# Patient Record
Sex: Female | Born: 2000 | Race: Black or African American | Hispanic: No | Marital: Single | Smoking: Never smoker
Health system: Southern US, Community
[De-identification: ages and names within clinical notes are randomized; demographics above are authoritative.]

## PROBLEM LIST (undated history)

## (undated) DIAGNOSIS — R17 Unspecified jaundice: Secondary | ICD-10-CM

## (undated) DIAGNOSIS — N390 Urinary tract infection, site not specified: Secondary | ICD-10-CM

## (undated) DIAGNOSIS — F988 Other specified behavioral and emotional disorders with onset usually occurring in childhood and adolescence: Secondary | ICD-10-CM

## (undated) DIAGNOSIS — J45909 Unspecified asthma, uncomplicated: Secondary | ICD-10-CM

## (undated) DIAGNOSIS — B019 Varicella without complication: Secondary | ICD-10-CM

## (undated) DIAGNOSIS — K219 Gastro-esophageal reflux disease without esophagitis: Secondary | ICD-10-CM

## (undated) HISTORY — DX: Unspecified jaundice: R17

## (undated) HISTORY — PX: TYMPANOSTOMY TUBE PLACEMENT: SHX32

## (undated) HISTORY — DX: Varicella without complication: B01.9

## (undated) HISTORY — DX: Gastro-esophageal reflux disease without esophagitis: K21.9

## (undated) HISTORY — DX: Other specified behavioral and emotional disorders with onset usually occurring in childhood and adolescence: F98.8

## (undated) HISTORY — DX: Urinary tract infection, site not specified: N39.0

---
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
## 2022-01-14 ENCOUNTER — Other Ambulatory Visit: Payer: Self-pay | Admitting: Physician Assistant

## 2022-01-21 ENCOUNTER — Encounter: Payer: Self-pay | Admitting: Physician Assistant

## 2022-01-21 ENCOUNTER — Ambulatory Visit (INDEPENDENT_AMBULATORY_CARE_PROVIDER_SITE_OTHER): Payer: BC Managed Care – PPO | Admitting: Physician Assistant

## 2022-01-21 VITALS — BP 100/66 | HR 95 | Temp 97.8°F | Ht 67.34 in | Wt 220.4 lb

## 2022-01-21 DIAGNOSIS — Z8379 Family history of other diseases of the digestive system: Secondary | ICD-10-CM | POA: Diagnosis not present

## 2022-01-21 DIAGNOSIS — Z0000 Encounter for general adult medical examination without abnormal findings: Secondary | ICD-10-CM | POA: Diagnosis not present

## 2022-01-21 DIAGNOSIS — E7800 Pure hypercholesterolemia, unspecified: Secondary | ICD-10-CM | POA: Diagnosis not present

## 2022-01-21 DIAGNOSIS — R140 Abdominal distension (gaseous): Secondary | ICD-10-CM

## 2022-01-21 LAB — CBC WITH DIFFERENTIAL/PLATELET
Basophils Absolute: 0 10*3/uL (ref 0.0–0.1)
Basophils Relative: 0.8 % (ref 0.0–3.0)
Eosinophils Absolute: 0.2 10*3/uL (ref 0.0–0.7)
Eosinophils Relative: 4.1 % (ref 0.0–5.0)
HCT: 41.5 % (ref 36.0–49.0)
Hemoglobin: 13.8 g/dL (ref 12.0–16.0)
Lymphocytes Relative: 37.6 % (ref 24.0–48.0)
Lymphs Abs: 2.2 10*3/uL (ref 0.7–4.0)
MCHC: 33.3 g/dL (ref 31.0–37.0)
MCV: 94.1 fl (ref 78.0–98.0)
Monocytes Absolute: 0.4 10*3/uL (ref 0.1–1.0)
Monocytes Relative: 6.8 % (ref 3.0–12.0)
Neutro Abs: 2.9 10*3/uL (ref 1.4–7.7)
Neutrophils Relative %: 50.7 % (ref 43.0–71.0)
Platelets: 174 10*3/uL (ref 150.0–575.0)
RBC: 4.41 Mil/uL (ref 3.80–5.70)
RDW: 12.9 % (ref 11.4–15.5)
WBC: 5.8 10*3/uL (ref 4.5–13.5)

## 2022-01-21 LAB — COMPREHENSIVE METABOLIC PANEL
ALT: 21 U/L (ref 0–35)
AST: 16 U/L (ref 0–37)
Albumin: 4.2 g/dL (ref 3.5–5.2)
Alkaline Phosphatase: 36 U/L — ABNORMAL LOW (ref 47–119)
BUN: 11 mg/dL (ref 6–23)
CO2: 27 mEq/L (ref 19–32)
Calcium: 9.3 mg/dL (ref 8.4–10.5)
Chloride: 105 mEq/L (ref 96–112)
Creatinine, Ser: 0.97 mg/dL (ref 0.40–1.20)
GFR: 84.9 mL/min (ref >60.00)
Glucose, Bld: 86 mg/dL (ref 70–99)
Potassium: 3.7 mEq/L (ref 3.5–5.1)
Sodium: 141 mEq/L (ref 135–145)
Total Bilirubin: 0.5 mg/dL (ref 0.2–1.2)
Total Protein: 7.1 g/dL (ref 6.0–8.3)

## 2022-01-21 LAB — LIPID PANEL
Cholesterol: 275 mg/dL — ABNORMAL HIGH (ref 0–200)
HDL: 59.4 mg/dL (ref >39.00)
LDL Cholesterol: 198 mg/dL — ABNORMAL HIGH (ref 0–99)
NonHDL: 215.87
Total CHOL/HDL Ratio: 5
Triglycerides: 87 mg/dL (ref 0.0–149.0)
VLDL: 17.4 mg/dL (ref 0.0–40.0)

## 2022-01-21 NOTE — Patient Instructions (Addendum)
Good to see you today, Morgan Marshall! ? ?Please go to the lab for blood work and I will send results through MyChart. ?Pending celiac labs, will determine next steps from there. ? ?Continue lifestyle habits. Walking daily. Limit sugary foods. ? ?Please check with parents about prior HPV vaccine status and Tetanus (Tdap) status.  ? ?Have a great summer!  ?

## 2022-01-21 NOTE — Progress Notes (Signed)
? ?Subjective:  ? ? Patient ID: Morgan Marshall, female    DOB: [birth year], 21 y.o.   MRN: [id number] ? ?Chief Complaint  ?Patient presents with  ? Annual Exam  ? Gastroesophageal Reflux  ?  Pt says it has worsened.   ? Form Completion  ?  Requesting dog companion.  ? ? ?Patient is in today for annual exam. ? ?Acute concerns: ?Wants to be tested for celiac. States this was positive in third grade when tested in [place]. Bloated, some diarrhea, extra full feelings. Dad has hx of celiac.  ?Pt tries to avoid gluten. Symptoms worse with gluten - usually has some n/v with this as well.  ? ?Health maintenance: ?Lifestyle/ exercise: Doing a lot of traveling recently; trying to walk mostly ?Nutrition: Attempts to be mostly gluten-free  ?Mental health: Will start Lexapro 10 mg this summer if needed, currently on Buspar 7.5 mg once daily ; less stressed since college semester is over  ?Caffeine: Diet soda; not a coffee or energy drinker ?Sleep: Better now that she's home from college  ?Substance use: None ?ETOH: None  ?Sexual activity: Never has been sexually active  ?Menstrual cycles: More regular, takes Junel Fe   ?Immunizations: Check on HPV and Tdap  ? ? ?Past Medical History:  ?Diagnosis Date  ? Asthma   ? Chicken pox   ? GERD (gastroesophageal reflux disease)   ? Jaundice   ? Urinary tract infection   ? ? ?Past Surgical History:  ?Procedure Laterality Date  ? TYMPANOSTOMY TUBE PLACEMENT    ? ? ?Family History  ?Problem Relation Age of Onset  ? Heart attack Mother   ? Hyperlipidemia Father   ? Asthma Brother   ? Arthritis Maternal Grandmother   ? Asthma Maternal Grandmother   ? Hyperlipidemia Maternal Grandmother   ? Hyperlipidemia Maternal Grandfather   ? Hyperlipidemia Paternal Grandmother   ? Early death Paternal Grandfather   ? ? ?Social History  ? ?Tobacco Use  ? Smoking status: Never  ? Smokeless tobacco: Never  ?Vaping Use  ? Vaping Use: Never used  ?Substance Use Topics  ? Alcohol use: Not Currently  ? Drug use: Not  Currently  ?  ? ?Allergies  ?Allergen Reactions  ? Latex   ? ? ?Review of Systems ?NEGATIVE UNLESS OTHERWISE INDICATED IN HPI ? ? ?   ?Objective:  ?  ? ?BP 100/66 (BP Location: Left Arm, Patient Position: Sitting, Cuff Size: Large)   Pulse 95   Temp 97.8 ?F (36.6 ?C) (Temporal)   Ht 5' 7.34" (1.71 m)   Wt 220 lb 6.4 oz (100 kg)   LMP 01/16/2022 (Exact Date)   SpO2 96%   BMI 34.17 kg/m?  ? ?Wt Readings from Last 3 Encounters:  ?01/21/22 220 lb 6.4 oz (100 kg) (99 %, Z= 2.21)*  ?12/31/21 219 lb (99.3 kg) (99 %, Z= 2.19)*  ?12/23/21 223 lb (101.2 kg) (99 %, Z= 2.24)*  ? ?* Growth percentiles are based on CDC (Girls, 2-20 Years) data.  ? ? ?BP Readings from Last 3 Encounters:  ?01/21/22 100/66  ?12/23/21 105/65  ?07/18/21 103/70  ?  ? ?Physical Exam ?Vitals and nursing note reviewed.  ?Constitutional:   ?   Appearance: Normal appearance. She is obese. She is not toxic-appearing.  ?HENT:  ?   Head: Normocephalic and atraumatic.  ?   Right Ear: Tympanic membrane, ear canal and external ear normal.  ?   Left Ear: Tympanic membrane, ear canal and external ear normal.  ?  Nose: Nose normal.  ?   Mouth/Throat:  ?   Mouth: Mucous membranes are moist.  ?Eyes:  ?   Extraocular Movements: Extraocular movements intact.  ?   Conjunctiva/sclera: Conjunctivae normal.  ?   Pupils: Pupils are equal, round, and reactive to light.  ?Cardiovascular:  ?   Rate and Rhythm: Normal rate and regular rhythm.  ?   Pulses: Normal pulses.  ?   Heart sounds: Normal heart sounds.  ?Pulmonary:  ?   Effort: Pulmonary effort is normal.  ?   Breath sounds: Normal breath sounds.  ?Abdominal:  ?   General: Abdomen is flat. Bowel sounds are normal.  ?   Palpations: Abdomen is soft.  ?Musculoskeletal:     ?   General: Normal range of motion.  ?   Cervical back: Normal range of motion and neck supple.  ?Skin: ?   General: Skin is warm and dry.  ?   Comments: No concerning moles noted ?  ?Neurological:  ?   General: No focal deficit present.  ?    Mental Status: She is alert and oriented to person, place, and time.  ?Psychiatric:     ?   Mood and Affect: Mood normal.     ?   Behavior: Behavior normal.     ?   Thought Content: Thought content normal.     ?   Judgment: Judgment normal.  ? ? ?   ?Assessment & Plan:  ? ?Problem List Items Addressed This Visit   ?None ?Visit Diagnoses   ? ? Encounter for annual physical exam    -  Primary  ? Relevant Orders  ? CBC with Differential/Platelet  ? Comprehensive metabolic panel  ? Lipid panel  ? High cholesterol      ? Relevant Orders  ? Lipid panel  ? Family history of celiac disease      ? Relevant Orders  ? Tissue transglutaminase, IgA  ? IgA  ? Generalized bloating      ? Relevant Orders  ? Tissue transglutaminase, IgA  ? IgA  ? ?  ? ? ?PLAN: ?Age-appropriate screening and counseling performed today. Will check labs and call with results. Preventive measures discussed and printed in AVS for patient.  ?-Patient also requesting specific labs for celiac screening.  Pending results will help to guide next steps. ?-Need to check on status of Tdap and HPV vaccines. ?-Continue to work on healthy lifestyle habits. ?-Not sexually active. ?-Encourage to wear seatbelt and avoid texting while driving. ? ?F/up 1 year or prn  ? ? ?This note was prepared with assistance of Conservation officer, historic buildings. Occasional wrong-word or sound-a-like substitutions may have occurred due to the inherent limitations of voice recognition software. ? ? ? ?Aaniyah Strohm M Rigley Niess, PA-C ?

## 2022-01-22 LAB — IGA: Immunoglobulin A: 108 mg/dL (ref 47–310)

## 2022-01-22 LAB — TISSUE TRANSGLUTAMINASE, IGA: (tTG) Ab, IgA: <1 U/mL

## 2022-02-05 ENCOUNTER — Ambulatory Visit (INDEPENDENT_AMBULATORY_CARE_PROVIDER_SITE_OTHER): Payer: BC Managed Care – PPO | Admitting: Orthopaedic Surgery

## 2022-02-05 DIAGNOSIS — M222X2 Patellofemoral disorders, left knee: Secondary | ICD-10-CM

## 2022-02-05 DIAGNOSIS — M222X1 Patellofemoral disorders, right knee: Secondary | ICD-10-CM

## 2022-02-05 NOTE — Progress Notes (Signed)
Chief Complaint: Bilateral knee pain     History of Present Illness:   02/05/2022: Presents today for follow-up of bilateral knee pain.  She states she got approximately 24 hours of relief from her previous injections but these were off.  She has been able to work on her exercises somewhat although this has been very limited.  She is scheduled to take a trip abroad to [place] later today.  Here today for further assessment.  Morgan Marshall is a 21 y.o. female presents with multiple years of bilateral knee pain.  She states that this is recently flared up as she is stopped playing soccer.  She does have a history of proximal tibia fracture as a child.  She states that she did not have any significant pain following that time but since she has not stopped soccer and being more active recently she states that the knee pain in the front of the knee has flared up in particular.  She has significant aggravation with going up and down stairs.  She has trialed anti-inflammatories with some relief.  She has not done physical therapy    Surgical History:   None  PMH/PSH/Family History/Social History/Meds/Allergies:    Past Medical History:  Diagnosis Date   Asthma    Chicken pox    GERD (gastroesophageal reflux disease)    Jaundice    Urinary tract infection    Past Surgical History:  Procedure Laterality Date   TYMPANOSTOMY TUBE PLACEMENT     Social History   Socioeconomic History   Marital status: Single    Spouse name: Not on file   Number of children: Not on file   Years of education: Not on file   Highest education level: Not on file  Occupational History   Not on file  Tobacco Use   Smoking status: Never   Smokeless tobacco: Never  Vaping Use   Vaping Use: Never used  Substance and Sexual Activity   Alcohol use: Not Currently   Drug use: Not Currently   Sexual activity: Not on file  Other Topics Concern   Not on file  Social History  Narrative   Not on file   Social Determinants of Health   Financial Resource Strain: Not on file  Food Insecurity: Not on file  Transportation Needs: Not on file  Physical Activity: Not on file  Stress: Not on file  Social Connections: Not on file   Family History  Problem Relation Age of Onset   Heart attack Mother    Hyperlipidemia Father    Asthma Brother    Arthritis Maternal Grandmother    Asthma Maternal Grandmother    Hyperlipidemia Maternal Grandmother    Hyperlipidemia Maternal Grandfather    Hyperlipidemia Paternal Grandmother    Early death Paternal Grandfather    Allergies  Allergen Reactions   Latex    Current Outpatient Medications  Medication Sig Dispense Refill   albuterol (PROVENTIL) (5 MG/ML) 0.5% nebulizer solution Take 2.5 mg by nebulization every 6 (six) hours as needed. (Patient not taking: Reported on 12/23/2021)     albuterol (VENTOLIN HFA) 108 (90 Base) MCG/ACT inhaler INHALE 1-2 PUFFS INTO THE LUNGS EVERY 6 HOURS AS NEEDED. 18 each 0   amoxicillin-clavulanate (AUGMENTIN) 875-125 MG tablet Take 1 tablet by mouth 2 (two) times daily. (Patient not taking:  Reported on 12/23/2021) 20 tablet 0   benzonatate (TESSALON PERLES) 100 MG capsule Take 1 capsule (100 mg total) by mouth 3 (three) times daily as needed for cough. 30 capsule 0   busPIRone (BUSPAR) 7.5 MG tablet Take one tab up to three times daily as needed for anxiety. 90 tablet 1   chlorhexidine (PERIDEX) 0.12 % solution SMARTSIG:2 Teaspoon By Mouth Twice Daily (Patient not taking: Reported on 07/18/2021)     escitalopram (LEXAPRO) 10 MG tablet Take 1 tablet (10 mg total) by mouth daily. 30 tablet 2   JUNEL FE 1.5/30 1.5-30 MG-MCG tablet Take 1 tablet by mouth daily. 84 tablet 3   neomycin-polymyxin-hydrocortisone (CORTISPORIN) OTIC solution Place 4 drops into the left ear 3 (three) times daily. (Patient not taking: Reported on 12/23/2021) 10 mL 0   oseltamivir (TAMIFLU) 75 MG capsule Take 1 capsule (75  mg total) by mouth 2 (two) times daily. (Patient not taking: Reported on 07/18/2021) 10 capsule 0   oseltamivir (TAMIFLU) 75 MG capsule Take 1 capsule (75 mg total) by mouth 2 (two) times daily. (Patient not taking: Reported on 12/23/2021) 10 capsule 0   predniSONE (DELTASONE) 50 MG tablet Take 1 tablet daily for 5 days. (Patient not taking: Reported on 07/18/2021) 5 tablet 0   Probiotic Product (PROBIOTIC DAILY PO) Take by mouth.     No current facility-administered medications for this visit.   No results found.  Review of Systems:   A ROS was performed including pertinent positives and negatives as documented in the HPI.  Physical Exam :   Constitutional: NAD and appears stated age Neurological: Alert and oriented Psych: Appropriate affect and cooperative Last menstrual period 01/16/2022.   Comprehensive Musculoskeletal Exam:      Musculoskeletal Exam  Gait Normal  Alignment Normal   Right Left  Inspection Normal Normal  Palpation    Tenderness Patellar fat pad Patellar fat pad  Crepitus None None  Effusion None None right  Range of Motion    Extension 0 0  Flexion 135 135  Strength    Extension 5/5 5/5  Flexion 5/5 5/5  Ligament Exam     Generalized Laxity No No  Lachman Negative Negative   Pivot Shift Negative Negative  Anterior Drawer Negative Negative  Valgus at 0 Negative Negative  Valgus at 20 Negative Negative  Varus at 0 0 0  Varus at 20   0 0  Posterior Drawer at 90 0 0  Vascular/Lymphatic Exam    Edema None None  Venous Stasis Changes No No  Distal Circulation Normal Normal  Neurologic    Light Touch Sensation Intact Intact  Special Tests:      Imaging:   Xray (4 views right knee, 4 views left knee): Normal   I personally reviewed and interpreted the radiographs.   Assessment:   21 year old female with bilateral knee pain more consistent with patellofemoral type pain.  Given the fact that she did not have any pain relief from her injections  into the Hoffa's fat pad I do believe that this is less likely her etiology of pain.  To that effect given the fact that she is having difficulty with her exercises I would like her to undergo a structured physical therapy routine for hip core and quad strengthening.  I did describe that this ultimately should have an excellent prognosis for her. Plan :    -Return to clinic in 3 months       I personally saw and evaluated the  patient, and participated in the management and treatment plan.  Vanetta Mulders, MD Attending Physician, Orthopedic Surgery  This document was dictated using Dragon voice recognition software. A reasonable attempt at proof reading has been made to minimize errors.

## 2022-02-20 ENCOUNTER — Ambulatory Visit (HOSPITAL_BASED_OUTPATIENT_CLINIC_OR_DEPARTMENT_OTHER): Payer: BC Managed Care – PPO | Attending: Orthopaedic Surgery | Admitting: Physical Therapy

## 2022-02-20 DIAGNOSIS — G8929 Other chronic pain: Secondary | ICD-10-CM | POA: Insufficient documentation

## 2022-02-20 DIAGNOSIS — M222X1 Patellofemoral disorders, right knee: Secondary | ICD-10-CM | POA: Insufficient documentation

## 2022-02-20 DIAGNOSIS — M25562 Pain in left knee: Secondary | ICD-10-CM | POA: Insufficient documentation

## 2022-02-20 DIAGNOSIS — M222X2 Patellofemoral disorders, left knee: Secondary | ICD-10-CM | POA: Diagnosis not present

## 2022-02-20 DIAGNOSIS — M25561 Pain in right knee: Secondary | ICD-10-CM | POA: Insufficient documentation

## 2022-02-20 NOTE — Therapy (Incomplete)
OUTPATIENT PHYSICAL THERAPY LOWER EXTREMITY EVALUATION   Patient Name: Morgan Marshall MRN: [id number] DOB:2000-10-02, 21 y.o., female Today's Date: 02/20/2022   Past Medical History:  Diagnosis Date   Asthma    Chicken pox    GERD (gastroesophageal reflux disease)    Jaundice    Urinary tract infection    Past Surgical History:  Procedure Laterality Date   TYMPANOSTOMY TUBE PLACEMENT     Patient Active Problem List   Diagnosis Date Noted   Asthma exacerbation, mild 07/18/2021   Acute mucoid otitis media of left ear 07/18/2021   GAD (generalized anxiety disorder) 12/31/2020   Nail avulsion, finger, initial encounter 08/24/2019   Irregular periods 02/17/2018   Asthma 11/17/2017   Childhood obesity 11/17/2017    PCP: Allwardt, Randa Evens, PA-C  REFERRING PROVIDER: Vanetta Mulders, MD   REFERRING DIAG: 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  Rationale for Evaluation and Treatment Rehabilitation  ONSET DATE: Sept 2022  SUBJECTIVE:   SUBJECTIVE STATEMENT: Pt presents with bil knee pain, R>L, insidious onset, chronic since Sept 2022. Pain is mostly with longer duration walking or standing, that gradually comes on. Pain is eased with rest or sitting, but standing up after prolonged sitting is painful. Pt history of multiple LE injuries that she feels are contributing. Pt frequently walks for exercise is family/friends and wants to be able to walk during upcoming vacation and at college without pain.  PERTINENT HISTORY: Bil tibia fracture and bil ankle sprains (R>L), L4/L5 compression fracture  PAIN:  Are you having pain? Yes: NPRS scale: 5/10 (with walking 20 min, 0/10 current) Pain location: Bil posterior to patella Pain description: Ache/creaking, frequent crepitus Aggravating factors: Standing, walking >89min Relieving factors: Sitting, rest  PRECAUTIONS: None  WEIGHT BEARING RESTRICTIONS No  FALLS:  Has patient  fallen in last 6 months? No  LIVING ENVIRONMENT: Lives with: lives with their family Lives in: House/apartment Stairs: Yes: Internal: 1 flight steps; Has following equipment at home: None  OCCUPATION: Student  PLOF: Independent  PATIENT GOALS Go on walks with family and walk around school without pain.   OBJECTIVE:   DIAGNOSTIC FINDINGS:  Bil mild patella alta  PATIENT SURVEYS:  {rehab surveys:24030}  COGNITION:  Overall cognitive status: Within functional limits for tasks assessed     SENSATION: WFL    POSTURE: No Significant postural limitations  PALPATION: Pt wants   LOWER EXTREMITY ROM:  Active ROM Right eval Left eval  Hip flexion 71.1 66.5  Hip extension    Hip abduction 68.3 58.2  Hip adduction    Hip internal rotation    Hip external rotation    Knee flexion    Knee extension 64.1 65.9  Ankle dorsiflexion    Ankle plantarflexion    Ankle inversion    Ankle eversion     (Blank rows = not tested)  LOWER EXTREMITY MMT:  MMT Right eval Left eval  Hip flexion 120 120  Hip extension    Hip abduction    Hip adduction    Hip internal rotation    Hip external rotation    Knee flexion 140 140  Knee extension -3 -3  Ankle dorsiflexion 8 13  Ankle plantarflexion    Ankle inversion    Ankle eversion     (Blank rows = not tested)    Squat - narrow base, mild valgus   Single leg stance - medial arch drop and mild/mod ankle instability, R>L  TODAY'S  TREATMENT: Eval TKE x15 w/red band Bridge/hip x15 w/red band Sidelying hip abd x15 w/red band SLR x15 w/red band  PATIENT EDUCATION:  Education details: HEP, shoewear/orthotics, progression of exercise Person educated: Patient Education method: Explanation Education comprehension: verbalized understanding   HOME EXERCISE PROGRAM: TKE Bridge w/bands Sidelying hip abd SLR  ASSESSMENT:  CLINICAL IMPRESSION: Patient is a 21 y.o. F who was seen today for physical therapy evaluation  and treatment for    OBJECTIVE IMPAIRMENTS decreased balance, difficulty walking, decreased ROM, and decreased strength.   ACTIVITY LIMITATIONS walking, standing, and stairs  PARTICIPATION LIMITATIONS: community activity  PERSONAL FACTORS Age, Fitness, and Past/current experiences are also affecting patient's functional outcome positively.   REHAB POTENTIAL: Excellent  CLINICAL DECISION MAKING: Stable/uncomplicated  EVALUATION COMPLEXITY: Low   GOALS: Goals reviewed with patient? Yes  SHORT TERM GOALS: Target date: {:PHR,WPLUS3}  *** Baseline: Goal status: {GOALSTATUS:25110}  2.  *** Baseline:  Goal status: {GOALSTATUS:25110}  3.  Pt will improve bil DF ROM to 15deg to improve gait mechanics. Baseline:  Goal status: INITIAL   LONG TERM GOALS: Target date: {:PHR,WPLUS6}   *** Baseline:  Goal status: {GOALSTATUS:25110}  2.  *** Baseline:  Goal status: {GOALSTATUS:25110}  3.  Pt will be fully independent with exercise/fitness routine to maintain function and fitness when at college. Baseline:  Goal status: INITIAL   PLAN: PT FREQUENCY: 1x/week  PT DURATION: 6 weeks  PLANNED INTERVENTIONS: Therapeutic exercises, Therapeutic activity, Neuromuscular re-education, Balance training, Gait training, Patient/Family education, Joint mobilization, Stair training, Dry Needling, Cryotherapy, and Moist heat  PLAN FOR NEXT SESSION: Advance LE ROM and strengthening, single leg balance, patellar mobilizations   Lenell Antu, Student-PT 02/20/2022, 12:24 PM   During this treatment session, the therapist was present, participating in and directing the treatment.

## 2022-02-21 ENCOUNTER — Encounter (HOSPITAL_BASED_OUTPATIENT_CLINIC_OR_DEPARTMENT_OTHER): Payer: Self-pay | Admitting: Physical Therapy

## 2022-02-21 NOTE — Therapy (Signed)
OUTPATIENT PHYSICAL THERAPY LOWER EXTREMITY EVALUATION     Patient Name: Morgan Marshall MRN: [id number] DOB:2001/09/01, 21 y.o., female Today's Date: 02/20/2022         Past Medical History:  Diagnosis Date   Asthma     Chicken pox     GERD (gastroesophageal reflux disease)     Jaundice     Urinary tract infection           Past Surgical History:  Procedure Laterality Date   TYMPANOSTOMY TUBE PLACEMENT            Patient Active Problem List    Diagnosis Date Noted   Asthma exacerbation, mild 07/18/2021   Acute mucoid otitis media of left ear 07/18/2021   GAD (generalized anxiety disorder) 12/31/2020   Nail avulsion, finger, initial encounter 08/24/2019   Irregular periods 02/17/2018   Asthma 11/17/2017   Childhood obesity 11/17/2017      PCP: Allwardt, Crist Infante, PA-C   REFERRING PROVIDER: Huel Cote, MD    REFERRING DIAG: 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   Rationale for Evaluation and Treatment Rehabilitation   ONSET DATE: Sept 2022   SUBJECTIVE:    SUBJECTIVE STATEMENT: Pt presents with bil knee pain, R>L, insidious onset, chronic since Sept 2022. Pain is mostly with longer duration walking or standing, that gradually comes on. Pain is eased with rest or sitting, but standing up after prolonged sitting is painful. Pt history of multiple LE injuries that she feels are contributing - several fractures of the tibia and recurrent ankle sprains. Pt frequently walks for exercise is family/friends and wants to be able to walk during upcoming vacation and at college without pain.   PERTINENT HISTORY: Bil tibia fracture and bil ankle sprains (R>L), L4/L5 compression fracture   PAIN:  Are you having pain? Yes: NPRS scale: 5/10 (with walking 20 min, 0/10 current) Pain location: Bil posterior to patella Pain description: Ache/creaking, frequent crepitus Aggravating factors: Standing, walking >43min Relieving  factors: Sitting, rest   PRECAUTIONS: None   WEIGHT BEARING RESTRICTIONS No   FALLS:  Has patient fallen in last 6 months? No   LIVING ENVIRONMENT: Lives with: lives with their family Lives in: House/apartment Stairs: Yes: Internal: 1 flight steps Has following equipment at home: None   OCCUPATION: Student   PLOF: Independent   PATIENT GOALS Go on walks with family and walk around school without pain.     OBJECTIVE:    DIAGNOSTIC FINDINGS:  Bil mild patella alta   PATIENT SURVEYS:  Deferred   COGNITION:           Overall cognitive status: Within functional limits for tasks assessed                          SENSATION: WFL   POSTURE: Decreased medial arch height in weight bearing   PALPATION: No tenderness to palpation   LOWER EXTREMITY ROM:   Active ROM Right eval Left eval  Hip flexion 71.1 66.5  Hip extension      Hip abduction 68.3 58.2  Hip adduction      Hip internal rotation      Hip external rotation      Knee flexion      Knee extension 64.1 65.9  Ankle dorsiflexion      Ankle plantarflexion      Ankle inversion      Ankle eversion       (  Blank rows = not tested)   LOWER EXTREMITY MMT:   MMT Right eval Left eval  Hip flexion 120 120  Hip extension      Hip abduction      Hip adduction      Hip internal rotation      Hip external rotation      Knee flexion 140 140  Knee extension -3 -3  Ankle dorsiflexion 8 13  Ankle plantarflexion      Ankle inversion      Ankle eversion       (Blank rows = not tested)                Squat - narrow base, moderate knee valgus             Single leg stance - medial arch drop and mild/mod ankle instability, R>L     TODAY'S TREATMENT: Eval TKE x15 w/red band Bridge/hip x15 w/red band Sidelying hip abd x15 w/red band SLR x15 w/red band   PATIENT EDUCATION:  Education details: HEP, shoewear/orthotics, progression of exercise Person educated: Patient Education method: Explanation Education  comprehension: verbalized understanding     HOME EXERCISE PROGRAM:  Access Code: [email or web address] URL: [email or web address] Date: 02/21/2022 Prepared by: Lorayne Bender  Exercises - Standing Terminal Knee Extension with Resistance  - 1 x daily - 7 x weekly - 3 sets - 10 reps - Sidelying Hip Abduction  - 1 x daily - 7 x weekly - 3 sets - 10 reps - Small Range Straight Leg Raise  - 1 x daily - 7 x weekly - 3 sets - 10 reps - Supine Bridge with Resistance Band  - 1 x daily - 7 x weekly - 3 sets - 10 reps  ASSESSMENT:   CLINICAL IMPRESSION: Patient is a 21 y.o. F who was seen today for physical therapy evaluation and treatment for bil patellofemoral pain. Pain is insidious onset, chronic in nature (of 9+ mo.), and mild in irritation - pain comes on after 20+ min of standing or walking. Pt demonstrates symptoms consistent with PFPS. Pt has greatest deficits in ankle ROM, general LE strength, single leg stability, balance> She would benefit from skilled therapy to return to gym exercises and prior active lifestyle.      OBJECTIVE IMPAIRMENTS decreased balance, difficulty walking, decreased ROM, and decreased strength.    ACTIVITY LIMITATIONS walking, standing, and stairs   PARTICIPATION LIMITATIONS: community activity   PERSONAL FACTORS Age, Fitness, and Past/current experiences are also affecting patient's functional outcome positively.    REHAB POTENTIAL: Excellent   CLINICAL DECISION MAKING: Stable/uncomplicated   EVALUATION COMPLEXITY: Low     GOALS: Goals reviewed with patient? Yes   SHORT TERM GOALS: Target date: 6/29 Pt will be able to perform 1 min of SLS static balance with good ankle strategy and without loss of balance to demonstrate improved SLS stability. Baseline: Goal status: INITIAL   2.  Pt will improve bil dorsiflexion AROM to 15deg to improve gait mechanics. Baseline:  Goal status: INITIAL   3.  Pt will improve hip abduction strength by 10% to  improve single leg stability. Baseline:  Goal status: INITIAL     LONG TERM GOALS: Target date: 7/20   Pt will be able to walk 30+ minutes and to classes without pain. Baseline:  Goal status: INITIAL   2.  Pt will be able to ascend/descend 1 flight of stairs without pain. Baseline:  Goal status: INITIAL   3.  Pt will be  fully independent with exercise/fitness routine to maintain function and fitness when at college. Baseline:  Goal status: INITIAL     PLAN: PT FREQUENCY: 1x/week   PT DURATION: 6 weeks   PLANNED INTERVENTIONS: Therapeutic exercises, Therapeutic activity, Neuromuscular re-education, Balance training, Gait training, Patient/Family education, Joint mobilization, Stair training, Dry Needling, Cryotherapy, and Moist heat; taping    PLAN FOR NEXT SESSION:  and strengthening, single leg balance, patellar mobilizations; consider McConnell taping, ankle ROM;     Lorayne Benderavid Tanzie Rothschild PT DPT  02/20/2022   Loura HaltMatthew Hamlet, Student-PT 02/20/2022, 12:24 PM    During this treatment session, the therapist was present, participating in and directing the treatment.

## 2022-04-26 ENCOUNTER — Other Ambulatory Visit: Payer: Self-pay | Admitting: Physician Assistant

## 2022-05-02 ENCOUNTER — Ambulatory Visit (HOSPITAL_BASED_OUTPATIENT_CLINIC_OR_DEPARTMENT_OTHER): Payer: BC Managed Care – PPO | Admitting: Orthopaedic Surgery

## 2022-05-05 DIAGNOSIS — R1013 Epigastric pain: Secondary | ICD-10-CM | POA: Diagnosis not present

## 2022-05-05 DIAGNOSIS — K253 Acute gastric ulcer without hemorrhage or perforation: Secondary | ICD-10-CM | POA: Diagnosis not present

## 2022-05-05 DIAGNOSIS — R42 Dizziness and giddiness: Secondary | ICD-10-CM | POA: Diagnosis not present

## 2022-05-05 DIAGNOSIS — R112 Nausea with vomiting, unspecified: Secondary | ICD-10-CM | POA: Diagnosis not present

## 2022-05-05 DIAGNOSIS — K219 Gastro-esophageal reflux disease without esophagitis: Secondary | ICD-10-CM | POA: Diagnosis not present

## 2022-05-06 ENCOUNTER — Inpatient Hospital Stay (HOSPITAL_COMMUNITY)
Admission: EM | Admit: 2022-05-06 | Discharge: 2022-05-10 | DRG: 418 | Disposition: A | Discharge status: Home / Self Care | Payer: BC Managed Care – PPO | Attending: Surgery | Admitting: Surgery

## 2022-05-06 ENCOUNTER — Emergency Department (HOSPITAL_COMMUNITY): Payer: BC Managed Care – PPO

## 2022-05-06 ENCOUNTER — Other Ambulatory Visit: Payer: Self-pay

## 2022-05-06 DIAGNOSIS — K811 Chronic cholecystitis: Principal | ICD-10-CM | POA: Diagnosis present

## 2022-05-06 DIAGNOSIS — K219 Gastro-esophageal reflux disease without esophagitis: Secondary | ICD-10-CM | POA: Diagnosis not present

## 2022-05-06 DIAGNOSIS — F419 Anxiety disorder, unspecified: Secondary | ICD-10-CM | POA: Diagnosis not present

## 2022-05-06 DIAGNOSIS — R109 Unspecified abdominal pain: Secondary | ICD-10-CM | POA: Diagnosis present

## 2022-05-06 DIAGNOSIS — Z6834 Body mass index (BMI) 34.0-34.9, adult: Secondary | ICD-10-CM | POA: Diagnosis not present

## 2022-05-06 DIAGNOSIS — K820 Obstruction of gallbladder: Secondary | ICD-10-CM | POA: Diagnosis not present

## 2022-05-06 DIAGNOSIS — R1011 Right upper quadrant pain: Principal | ICD-10-CM

## 2022-05-06 DIAGNOSIS — R1013 Epigastric pain: Secondary | ICD-10-CM | POA: Diagnosis not present

## 2022-05-06 DIAGNOSIS — Z8349 Family history of other endocrine, nutritional and metabolic diseases: Secondary | ICD-10-CM

## 2022-05-06 DIAGNOSIS — Z91040 Latex allergy status: Secondary | ICD-10-CM | POA: Diagnosis not present

## 2022-05-06 DIAGNOSIS — J45909 Unspecified asthma, uncomplicated: Secondary | ICD-10-CM | POA: Diagnosis present

## 2022-05-06 DIAGNOSIS — Z8249 Family history of ischemic heart disease and other diseases of the circulatory system: Secondary | ICD-10-CM

## 2022-05-06 DIAGNOSIS — R112 Nausea with vomiting, unspecified: Secondary | ICD-10-CM | POA: Diagnosis not present

## 2022-05-06 DIAGNOSIS — Z79899 Other long term (current) drug therapy: Secondary | ICD-10-CM

## 2022-05-06 DIAGNOSIS — R110 Nausea: Secondary | ICD-10-CM | POA: Diagnosis not present

## 2022-05-06 DIAGNOSIS — R079 Chest pain, unspecified: Secondary | ICD-10-CM | POA: Diagnosis not present

## 2022-05-06 DIAGNOSIS — Z825 Family history of asthma and other chronic lower respiratory diseases: Secondary | ICD-10-CM | POA: Diagnosis not present

## 2022-05-06 DIAGNOSIS — R1010 Upper abdominal pain, unspecified: Secondary | ICD-10-CM | POA: Diagnosis not present

## 2022-05-06 DIAGNOSIS — E876 Hypokalemia: Secondary | ICD-10-CM | POA: Diagnosis present

## 2022-05-06 DIAGNOSIS — Z8261 Family history of arthritis: Secondary | ICD-10-CM | POA: Diagnosis not present

## 2022-05-06 DIAGNOSIS — K589 Irritable bowel syndrome without diarrhea: Secondary | ICD-10-CM | POA: Diagnosis present

## 2022-05-06 DIAGNOSIS — Z9049 Acquired absence of other specified parts of digestive tract: Secondary | ICD-10-CM | POA: Diagnosis not present

## 2022-05-06 DIAGNOSIS — K828 Other specified diseases of gallbladder: Secondary | ICD-10-CM | POA: Diagnosis not present

## 2022-05-06 LAB — COMPREHENSIVE METABOLIC PANEL
ALT: 17 U/L (ref 0–44)
AST: 15 U/L (ref 15–41)
Albumin: 3.9 g/dL (ref 3.5–5.0)
Alkaline Phosphatase: 36 U/L — ABNORMAL LOW (ref 38–126)
Anion gap: 9 (ref 5–15)
BUN: 9 mg/dL (ref 6–20)
CO2: 20 mmol/L — ABNORMAL LOW (ref 22–32)
Calcium: 9.2 mg/dL (ref 8.9–10.3)
Chloride: 109 mmol/L (ref 98–111)
Creatinine, Ser: 0.89 mg/dL (ref 0.44–1.00)
GFR, Estimated: >60 mL/min (ref >60)
Glucose, Bld: 77 mg/dL (ref 70–99)
Potassium: 3.6 mmol/L (ref 3.5–5.1)
Sodium: 138 mmol/L (ref 135–145)
Total Bilirubin: 0.5 mg/dL (ref 0.3–1.2)
Total Protein: 6.9 g/dL (ref 6.5–8.1)

## 2022-05-06 LAB — CBC WITH DIFFERENTIAL/PLATELET
Abs Immature Granulocytes: 0.01 10*3/uL (ref 0.00–0.07)
Basophils Absolute: 0 10*3/uL (ref 0.0–0.1)
Basophils Relative: 0 %
Eosinophils Absolute: 0.2 10*3/uL (ref 0.0–0.5)
Eosinophils Relative: 3 %
HCT: 39.6 % (ref 36.0–46.0)
Hemoglobin: 13.6 g/dL (ref 12.0–15.0)
Immature Granulocytes: 0 %
Lymphocytes Relative: 27 %
Lymphs Abs: 1.9 10*3/uL (ref 0.7–4.0)
MCH: 31.3 pg (ref 26.0–34.0)
MCHC: 34.3 g/dL (ref 30.0–36.0)
MCV: 91 fL (ref 80.0–100.0)
Monocytes Absolute: 0.4 10*3/uL (ref 0.1–1.0)
Monocytes Relative: 5 %
Neutro Abs: 4.5 10*3/uL (ref 1.7–7.7)
Neutrophils Relative %: 65 %
Platelets: 156 10*3/uL (ref 150–400)
RBC: 4.35 MIL/uL (ref 3.87–5.11)
RDW: 11.7 % (ref 11.5–15.5)
WBC: 7 10*3/uL (ref 4.0–10.5)
nRBC: 0 % (ref 0.0–0.2)

## 2022-05-06 LAB — LIPASE, BLOOD: Lipase: 29 U/L (ref 11–51)

## 2022-05-06 LAB — HCG, QUANTITATIVE, PREGNANCY: hCG, Beta Chain, Quant, S: <1 m[IU]/mL (ref <5)

## 2022-05-06 MED ORDER — ONDANSETRON HCL 4 MG/2ML IJ SOLN
4.0000 mg | Freq: Once | INTRAMUSCULAR | Status: AC
Start: 2022-05-06 — End: 2022-05-06
  Administered 2022-05-06: 4 mg via INTRAVENOUS
  Filled 2022-05-06: qty 2

## 2022-05-06 MED ORDER — FENTANYL CITRATE PF 50 MCG/ML IJ SOSY
50.0000 ug | PREFILLED_SYRINGE | Freq: Once | INTRAMUSCULAR | Status: AC
  Administered 2022-05-06: 50 ug via INTRAVENOUS
  Filled 2022-05-06: qty 1

## 2022-05-06 MED ORDER — ACETAMINOPHEN 325 MG PO TABS
650.0000 mg | ORAL_TABLET | Freq: Four times a day (QID) | ORAL | Status: DC | PRN
  Administered 2022-05-08: 650 mg via ORAL
  Filled 2022-05-06 (×2): qty 2

## 2022-05-06 MED ORDER — LACTATED RINGERS IV SOLN: INTRAVENOUS | Status: DC

## 2022-05-06 MED ORDER — ONDANSETRON HCL 4 MG/2ML IJ SOLN
4.0000 mg | Freq: Four times a day (QID) | INTRAMUSCULAR | Status: DC | PRN
Start: 2022-05-06 — End: 2022-05-08
  Administered 2022-05-06 – 2022-05-07 (×4): 4 mg via INTRAVENOUS
  Filled 2022-05-06 (×4): qty 2

## 2022-05-06 MED ORDER — LIDOCAINE VISCOUS HCL 2 % MT SOLN
15.0000 mL | Freq: Once | OROMUCOSAL | Status: AC
  Administered 2022-05-06: 15 mL via ORAL
  Filled 2022-05-06: qty 15

## 2022-05-06 MED ORDER — ENOXAPARIN SODIUM 40 MG/0.4ML IJ SOSY
40.0000 mg | PREFILLED_SYRINGE | INTRAMUSCULAR | Status: DC
  Administered 2022-05-07 – 2022-05-09 (×3): 40 mg via SUBCUTANEOUS
  Filled 2022-05-06 (×4): qty 0.4

## 2022-05-06 MED ORDER — LORAZEPAM 2 MG/ML IJ SOLN
0.5000 mg | Freq: Once | INTRAMUSCULAR | Status: AC | PRN
  Administered 2022-05-06: 0.5 mg via INTRAVENOUS
  Filled 2022-05-06: qty 1

## 2022-05-06 MED ORDER — FAMOTIDINE IN NACL 20-0.9 MG/50ML-% IV SOLN
20.0000 mg | Freq: Once | INTRAVENOUS | Status: AC
Start: 2022-05-06 — End: 2022-05-06
  Administered 2022-05-06: 20 mg via INTRAVENOUS
  Filled 2022-05-06: qty 50

## 2022-05-06 MED ORDER — ACETAMINOPHEN 650 MG RE SUPP: 650.0000 mg | Freq: Four times a day (QID) | RECTAL | Status: DC | PRN

## 2022-05-06 MED ORDER — ALUM & MAG HYDROXIDE-SIMETH 200-200-20 MG/5ML PO SUSP
30.0000 mL | Freq: Once | ORAL | Status: AC
  Administered 2022-05-06: 30 mL via ORAL
  Filled 2022-05-06: qty 30

## 2022-05-06 MED ORDER — MORPHINE SULFATE (PF) 2 MG/ML IV SOLN: 1.0000 mg | INTRAVENOUS | Status: DC | PRN

## 2022-05-06 MED ORDER — LACTATED RINGERS IV BOLUS
1000.0000 mL | Freq: Once | INTRAVENOUS | Status: AC
  Administered 2022-05-06: 1000 mL via INTRAVENOUS

## 2022-05-06 MED ORDER — FAMOTIDINE IN NACL 20-0.9 MG/50ML-% IV SOLN
20.0000 mg | Freq: Two times a day (BID) | INTRAVENOUS | Status: DC
  Administered 2022-05-07 – 2022-05-08 (×3): 20 mg via INTRAVENOUS
  Filled 2022-05-06 (×3): qty 50

## 2022-05-06 NOTE — ED Triage Notes (Signed)
Pt reports upper abdominal pain, back pain, nausea, and vomiting since Thursday. Pt was seen at an ER yesterday in [place], reports labs and ct were normal. Pt states symptoms have not improved.

## 2022-05-06 NOTE — ED Provider Triage Note (Signed)
Emergency Medicine Provider Triage Evaluation Note  Morgan Marshall , a 21 y.o. female  was evaluated in triage.  Pt complains of upper abdominal pain for the past few days with nausea. Denies any vomiting, diarrhea, fever, or melena. She was seen at [facility] in [place] yesterday with lab work and CT scans and was discharged home on Pepcid and carafate. She has a long standing h/o GERD and has had an EGD before.   Review of Systems  Positive:  Negative:   Physical Exam  BP 123/69   Pulse 62   Temp 98.2 F (36.8 C) (Oral)   Resp 17   Ht 5\' 8"  (1.727 m)   Wt 104.3 kg   LMP 04/07/2022   SpO2 99%   BMI 34.97 kg/m  Gen:   Awake, no distress   Resp:  Normal effort  MSK:   Moves extremities without difficulty  Other:  Abdomen soft. Epigastric tenderness. No guarding or rebound. Soft, non-distended.  Medical Decision Making  Medically screening exam initiated at 6:40 PM.  Appropriate orders placed.  04/09/2022 was informed that the remainder of the evaluation will be completed by another provider, this initial triage assessment does not replace that evaluation, and the importance of remaining in the ED until their evaluation is complete.  Will order RUQ Lajoyce Corners to rule out any gall baldder etiology and repeat labs.    [place], Achille Rich 05/06/22 1842

## 2022-05-06 NOTE — H&P (Signed)
History and Physical    Morgan Marshall YDX:[id number] DOB: 11-18-2000 DOA: 05/06/2022  PCP: Fredirick Lathe, PA-C  Patient coming from: Home  I have personally briefly reviewed patient's old medical records in [place]  Chief Complaint: Abdominal pain  HPI: Morgan Marshall is a 21 y.o. female with medical history significant for GERD who presented to the ED for evaluation of persistent abdominal pain.  Patient states that she has been having progressive sharp epigastric pain over the last 4-5 days.  Pain radiates directly towards her back.  She has had associated nausea and vomiting and has not been able to maintain adequate oral intake the last 2 days.  She went to [facility] ED yesterday (8/21).  Labs and CT abdomen/pelvis were reassuring.  She was given prescriptions for Protonix, Carafate, Zofran.  These medications have not provided any significant relief.  Patient states that symptoms were initially intermittent and worsened with oral intake but has been constant over the last day.  She states that she did undergo an EGD in 2019 at [facility] when she was living in [place].  She was told that there were changes of GERD without any other significant finding.  ED Course  Labs/Imaging on admission: I have personally reviewed following labs and imaging studies.  Initial vitals showed BP 123/69, pulse 62, RR 17, temp 98.2 F, SPO2 99% on room air.  Labs show WBC 7.0, hemoglobin 13.6, platelets 156,000, sodium 138, potassium 3.6, bicarb 20, BUN 9, creatinine 0.9, serum glucose 77, AST 15, NT 17, alk phos 36, total bilirubin 0.5, lipase 29.  Serum hCG <1.  RUQ ultrasound negative for acute findings.  Portable chest x-ray negative for focal consolidation, edema, effusion.  Patient was given 1 L normal saline, GI cocktail, Zofran, IV Pepcid.  EDP discussed with on-call GI, Dr. Benson Norway, who will see in consultation in the morning.  The hospitalist service was consulted to admit  for further evaluation and management.  Review of Systems: All systems reviewed and are negative except as documented in history of present illness above.   Past Medical History:  Diagnosis Date   Asthma    Chicken pox    GERD (gastroesophageal reflux disease)    Jaundice    Urinary tract infection     Past Surgical History:  Procedure Laterality Date   TYMPANOSTOMY TUBE PLACEMENT      Social History:  reports that she has never smoked. She has never used smokeless tobacco. She reports that she does not currently use alcohol. She reports that she does not currently use drugs.  Allergies  Allergen Reactions   Latex Hives    Family History  Problem Relation Age of Onset   Heart attack Mother    Hyperlipidemia Father    Asthma Brother    Arthritis Maternal Grandmother    Asthma Maternal Grandmother    Hyperlipidemia Maternal Grandmother    Hyperlipidemia Maternal Grandfather    Hyperlipidemia Paternal Grandmother    Early death Paternal Grandfather      Prior to Admission medications   Medication Sig Start Date End Date Taking? Authorizing Provider  albuterol (PROVENTIL) (5 MG/ML) 0.5% nebulizer solution Take 2.5 mg by nebulization every 6 (six) hours as needed. Patient not taking: Reported on 12/23/2021    [provider]  albuterol (VENTOLIN HFA) 108 (90 Base) MCG/ACT inhaler INHALE 1-2 PUFFS INTO THE LUNGS EVERY 6 HOURS AS NEEDED. 12/31/21   Allwardt, Randa Evens, PA-C  amoxicillin-clavulanate (AUGMENTIN) 875-125 MG tablet Take  1 tablet by mouth 2 (two) times daily. Patient not taking: Reported on 12/23/2021 07/18/21   Jeanie Sewer, NP  benzonatate (TESSALON PERLES) 100 MG capsule Take 1 capsule (100 mg total) by mouth 3 (three) times daily as needed for cough. 12/31/21   Allwardt, Randa Evens, PA-C  busPIRone (BUSPAR) 7.5 MG tablet Take one tab up to three times daily as needed for anxiety. 12/23/21   Allwardt, Randa Evens, PA-C  chlorhexidine (PERIDEX) 0.12 %  solution SMARTSIG:2 Teaspoon By Mouth Twice Daily Patient not taking: Reported on 07/18/2021 05/22/21   [provider]  escitalopram (LEXAPRO) 10 MG tablet Take 1 tablet (10 mg total) by mouth daily. 12/23/21   Allwardt, Alyssa M, PA-C  JUNEL FE 1.5/30 1.5-30 MG-MCG tablet TAKE 1 TABLET BY MOUTH EVERY DAY 04/28/22   Allwardt, Randa Evens, PA-C  neomycin-polymyxin-hydrocortisone (CORTISPORIN) OTIC solution Place 4 drops into the left ear 3 (three) times daily. Patient not taking: Reported on 12/23/2021 07/18/21   Jeanie Sewer, NP  oseltamivir (TAMIFLU) 75 MG capsule Take 1 capsule (75 mg total) by mouth 2 (two) times daily. Patient not taking: Reported on 07/18/2021 07/05/21   Vivi Barrack, MD  oseltamivir (TAMIFLU) 75 MG capsule Take 1 capsule (75 mg total) by mouth 2 (two) times daily. Patient not taking: Reported on 12/23/2021 08/16/21   Allwardt, Randa Evens, PA-C  predniSONE (DELTASONE) 50 MG tablet Take 1 tablet daily for 5 days. Patient not taking: Reported on 07/18/2021 07/05/21   Vivi Barrack, MD  Probiotic Product (PROBIOTIC DAILY PO) Take by mouth.    [provider]    Physical Exam: Vitals:   05/06/22 2215 05/06/22 2230 05/06/22 2245 05/06/22 2300  BP: 110/88     Pulse: 67  73 61  Resp: (!) _0 (!) 21  Temp:      TempSrc:      SpO2: 100%  100% 100%  Weight:      Height:       Constitutional: Sitting up in bed, NAD, calm, comfortable Eyes: EOMI, lids and conjunctivae normal ENMT: Mucous membranes are dry. Posterior pharynx clear of any exudate or lesions.Normal dentition.  Neck: normal, supple, no masses. Respiratory: clear to auscultation bilaterally, no wheezing, no crackles. Normal respiratory effort. No accessory muscle use.  Cardiovascular: Regular rate and rhythm, no murmurs / rubs / gallops. No extremity edema. 2+ pedal pulses. Abdomen: Epigastric tenderness, no masses palpated. No hepatosplenomegaly. Musculoskeletal: no clubbing / cyanosis. No  joint deformity upper and lower extremities. Good ROM, no contractures. Normal muscle tone.  Skin: no rashes, lesions, ulcers. No induration Neurologic: Sensation intact. Strength 5/5 in all 4.  Psychiatric: Alert and oriented x 3.  EKG: Not performed.  Assessment/Plan Principal Problem:   Epigastric pain   Galaxy Borden is a 21 y.o. female with medical history significant for GERD who is admitted with persistent epigastric pain and inability to tolerate oral intake.  Assessment and Plan: * Epigastric pain Persistent epigastric pain with nausea, vomiting, inability maintain adequate oral intake.  Negative CT A/P at [facility] on 8/21.  RUQ ultrasound negative.  Labs reassuring.  Denies any significant NSAID use.  Question of gastritis or peptic ulcer disease. -GI to see in a.m. -Keep n.p.o. after midnight -Continue IV fluids overnight while n.p.o. -IV Pepcid 20 mg BID  DVT prophylaxis: enoxaparin (LOVENOX) injection 40 mg Start: 05/07/22 2200 Code Status: Full code Family Communication: Mother at bedside Disposition Plan: From home and likely discharge to home pending GI evaluation  Consults called: GI, Dr. Benson Norway Severity of Illness: The appropriate patient status for this patient is OBSERVATION. Observation status is judged to be reasonable and necessary in order to provide the required intensity of service to ensure the patient's safety. The patient's presenting symptoms, physical exam findings, and initial radiographic and laboratory data in the context of their medical condition is felt to place them at decreased risk for further clinical deterioration. Furthermore, it is anticipated that the patient will be medically stable for discharge from the hospital within 2 midnights of admission.   Zada Finders MD [facility]  If 7PM-7AM, please contact night-coverage [email or web address]  05/06/2022, 11:42 PM

## 2022-05-06 NOTE — ED Provider Notes (Signed)
[facility]-EMERGENCY DEPT Provider Note   CSN: [id number] Arrival date & time: 05/06/22  1737     History  Chief Complaint  Patient presents with   Abdominal Pain   Back Pain    Morgan Marshall is a 21 y.o. female.   Abdominal Pain Back Pain Associated symptoms: abdominal pain      21 year old female presenting to the emergency department with nausea and upper abdominal pain for the past few days.  She denies any fever or chills, vomiting, diarrhea, melena.  She was seen at [facility] in [place] yesterday during which time lab work was normal and a CT scan was normal.  She was discharged home on Pepcid and Carafate.  She has a longstanding history of GERD.  There was some concern for peptic ulcer and she was advised to follow-up with GI.  Since discharge, she has been unable to tolerate any oral intake.  She endorses persistent nausea.  She denies any sharp worsening of pain but states that pain has not improved and is still fairly severe in the epigastrium and right upper quadrant.  Pain radiates to her back.  Home Medications Prior to Admission medications   Medication Sig Start Date End Date Taking? Authorizing Provider  albuterol (PROVENTIL) (5 MG/ML) 0.5% nebulizer solution Take 2.5 mg by nebulization every 6 (six) hours as needed. Patient not taking: Reported on 12/23/2021    [provider]  albuterol (VENTOLIN HFA) 108 (90 Base) MCG/ACT inhaler INHALE 1-2 PUFFS INTO THE LUNGS EVERY 6 HOURS AS NEEDED. 12/31/21   Allwardt, Crist Infante, PA-C  amoxicillin-clavulanate (AUGMENTIN) 875-125 MG tablet Take 1 tablet by mouth 2 (two) times daily. Patient not taking: Reported on 12/23/2021 07/18/21   Dulce Sellar, NP  benzonatate (TESSALON PERLES) 100 MG capsule Take 1 capsule (100 mg total) by mouth 3 (three) times daily as needed for cough. 12/31/21   Allwardt, Crist Infante, PA-C  busPIRone (BUSPAR) 7.5 MG tablet Take one tab up to three times daily as  needed for anxiety. 12/23/21   Allwardt, Crist Infante, PA-C  chlorhexidine (PERIDEX) 0.12 % solution SMARTSIG:2 Teaspoon By Mouth Twice Daily Patient not taking: Reported on 07/18/2021 05/22/21   [provider]  escitalopram (LEXAPRO) 10 MG tablet Take 1 tablet (10 mg total) by mouth daily. 12/23/21   Allwardt, Alyssa M, PA-C  JUNEL FE 1.5/30 1.5-30 MG-MCG tablet TAKE 1 TABLET BY MOUTH EVERY DAY 04/28/22   Allwardt, Crist Infante, PA-C  neomycin-polymyxin-hydrocortisone (CORTISPORIN) OTIC solution Place 4 drops into the left ear 3 (three) times daily. Patient not taking: Reported on 12/23/2021 07/18/21   Dulce Sellar, NP  oseltamivir (TAMIFLU) 75 MG capsule Take 1 capsule (75 mg total) by mouth 2 (two) times daily. Patient not taking: Reported on 07/18/2021 07/05/21   Ardith Dark, MD  oseltamivir (TAMIFLU) 75 MG capsule Take 1 capsule (75 mg total) by mouth 2 (two) times daily. Patient not taking: Reported on 12/23/2021 08/16/21   Allwardt, Crist Infante, PA-C  predniSONE (DELTASONE) 50 MG tablet Take 1 tablet daily for 5 days. Patient not taking: Reported on 07/18/2021 07/05/21   Ardith Dark, MD  Probiotic Product (PROBIOTIC DAILY PO) Take by mouth.    [provider]      Allergies    Latex    Review of Systems   Review of Systems  Gastrointestinal:  Positive for abdominal pain.  Musculoskeletal:  Positive for back pain.  All other systems reviewed and are negative.   Physical  Exam Updated Vital Signs BP 110/88   Pulse 67   Temp 98.3 F (36.8 C) (Oral)   Resp (!) 21   Ht 5\' 8"  (1.727 m)   Wt 104.3 kg   LMP 04/07/2022   SpO2 100%   BMI 34.97 kg/m  Physical Exam Vitals and nursing note reviewed.  Constitutional:      General: She is not in acute distress.    Appearance: She is well-developed.  HENT:     Head: Normocephalic and atraumatic.  Eyes:     Conjunctiva/sclera: Conjunctivae normal.  Cardiovascular:     Rate and Rhythm: Normal rate and regular rhythm.      Heart sounds: No murmur heard. Pulmonary:     Effort: Pulmonary effort is normal. No respiratory distress.     Breath sounds: Normal breath sounds.  Abdominal:     Palpations: Abdomen is soft.     Tenderness: There is abdominal tenderness in the right upper quadrant and epigastric area. There is guarding. There is no rebound.  Musculoskeletal:        General: No swelling.     Cervical back: Neck supple.  Skin:    General: Skin is warm and dry.     Capillary Refill: Capillary refill takes less than 2 seconds.  Neurological:     Mental Status: She is alert.  Psychiatric:        Mood and Affect: Mood normal.     ED Results / Procedures / Treatments   Labs (all labs ordered are listed, but only abnormal results are displayed) Labs Reviewed  COMPREHENSIVE METABOLIC PANEL - Abnormal; Notable for the following components:      Result Value   CO2 20 (*)    Alkaline Phosphatase 36 (*)    All other components within normal limits  LIPASE, BLOOD  CBC WITH DIFFERENTIAL/PLATELET  HCG, QUANTITATIVE, PREGNANCY  URINALYSIS, ROUTINE W REFLEX MICROSCOPIC    EKG None  Radiology DG Chest Portable 1 View  Result Date: 05/06/2022 CLINICAL DATA:  Upper abdominal pain EXAM: PORTABLE CHEST 1 VIEW COMPARISON:  None Available. FINDINGS: The heart size and mediastinal contours are within normal limits. Both lungs are clear. The visualized skeletal structures are unremarkable. No free intraperitoneal gas seen beneath the hemidiaphragms. IMPRESSION: No active disease. Electronically Signed   By: 05/08/2022 M.D.   On: 05/06/2022 22:29   05/08/2022 Abdomen Limited RUQ (LIVER/GB)  Result Date: 05/06/2022 CLINICAL DATA:  Get epigastric pain EXAM: ULTRASOUND ABDOMEN LIMITED RIGHT UPPER QUADRANT COMPARISON:  None Available. FINDINGS: Gallbladder: No gallstones or wall thickening visualized. No sonographic Murphy sign noted by sonographer. Common bile duct: Diameter: Normal caliber, 2 mm Liver: No focal lesion  identified. Within normal limits in parenchymal echogenicity. Portal vein is patent on color Doppler imaging with normal direction of blood flow towards the liver. Other: None. IMPRESSION: No acute findings. Electronically Signed   By: 05/08/2022 M.D.   On: 05/06/2022 19:17    Procedures Procedures    Medications Ordered in ED Medications  famotidine (PEPCID) IVPB 20 mg premix (20 mg Intravenous New Bag/Given 05/06/22 2229)  alum & mag hydroxide-simeth (MAALOX/MYLANTA) 200-200-20 MG/5ML suspension 30 mL (30 mLs Oral Given 05/06/22 2228)    And  lidocaine (XYLOCAINE) 2 % viscous mouth solution 15 mL (15 mLs Oral Given 05/06/22 2229)  lactated ringers bolus 1,000 mL (1,000 mLs Intravenous New Bag/Given 05/06/22 2229)  fentaNYL (SUBLIMAZE) injection 50 mcg (50 mcg Intravenous Given 05/06/22 2228)  ondansetron (ZOFRAN) injection 4 mg (  4 mg Intravenous Given 05/06/22 2228)    ED Course/ Medical Decision Making/ A&P                           Medical Decision Making Amount and/or Complexity of Data Reviewed Radiology: ordered.  Risk OTC drugs. Prescription drug management. Decision regarding hospitalization.    21 year old female presenting to the emergency department with nausea and upper abdominal pain for the past few days.  She denies any fever or chills, vomiting, diarrhea, melena.  She was seen at [facility] in [place] yesterday during which time lab work was normal and a CT scan was normal.  She was discharged home on Pepcid and Carafate.  She has a longstanding history of GERD.  There was some concern for peptic ulcer and she was advised to follow-up with GI.  Since discharge, she has been unable to tolerate any oral intake.  She endorses persistent nausea.  She denies any sharp worsening of pain but states that pain has not improved and is still fairly severe in the epigastrium and right upper quadrant.  Pain radiates to her back.  On arrival, patient was vitally stable.  NSR noted  on cardiac telemetry.  Initiated in triage significant for hCG normal, CMP without significant abnormality, lipase normal, CBC without abnormality.  Ultrasound of the right upper quadrant was performed which was negative.  Patient with focal tenderness palpation of the right upper quadrant and epigastrium.  Concern for possible peptic ulcer.  Patient is not peritonitic.  She is unable to tolerate oral intake and due to this, gastroenterology was consulted for consideration for EGD.  We will plan for admission in the setting of inability to tolerate oral intake and abdominal pain with concern for peptic ulcer.   Dr. Elnoria Howard of gastroenterology was consulted and will see the patient in consultation.  Dr. Allena Katz of [facility] accepted the patient in admission.  Patient admitted in stable condition.   Final Clinical Impression(s) / ED Diagnoses Final diagnoses:  Right upper quadrant abdominal pain    Rx / DC Orders ED Discharge Orders     None         Ernie Avena, MD 05/06/22 2235

## 2022-05-06 NOTE — Assessment & Plan Note (Signed)
Persistent epigastric pain with nausea, vomiting, inability maintain adequate oral intake.  Negative CT A/P at [facility] on 8/21.  RUQ ultrasound negative.  Labs reassuring.  Denies any significant NSAID use.  Question of gastritis or peptic ulcer disease. -GI to see in a.m. -Keep n.p.o. after midnight -Continue IV fluids overnight while n.p.o. -IV Pepcid 20 mg BID

## 2022-05-06 NOTE — Hospital Course (Signed)
Morgan Marshall is a 21 y.o. female with medical history significant for GERD who is admitted with persistent epigastric pain and inability to tolerate oral intake.

## 2022-05-07 ENCOUNTER — Observation Stay (HOSPITAL_COMMUNITY): Payer: BC Managed Care – PPO | Admitting: Certified Registered Nurse Anesthetist

## 2022-05-07 ENCOUNTER — Encounter (HOSPITAL_COMMUNITY)
Admission: EM | Disposition: A | Discharge status: Home / Self Care | Payer: Self-pay | Source: Home / Self Care | Attending: Internal Medicine

## 2022-05-07 ENCOUNTER — Encounter (HOSPITAL_COMMUNITY): Payer: Self-pay | Admitting: Internal Medicine

## 2022-05-07 DIAGNOSIS — R1013 Epigastric pain: Secondary | ICD-10-CM | POA: Diagnosis not present

## 2022-05-07 HISTORY — PX: ESOPHAGOGASTRODUODENOSCOPY: SHX5428

## 2022-05-07 LAB — COMPREHENSIVE METABOLIC PANEL
ALT: 13 U/L (ref 0–44)
AST: 13 U/L — ABNORMAL LOW (ref 15–41)
Albumin: 3.4 g/dL — ABNORMAL LOW (ref 3.5–5.0)
Alkaline Phosphatase: 32 U/L — ABNORMAL LOW (ref 38–126)
Anion gap: 9 (ref 5–15)
BUN: 9 mg/dL (ref 6–20)
CO2: 20 mmol/L — ABNORMAL LOW (ref 22–32)
Calcium: 8.8 mg/dL — ABNORMAL LOW (ref 8.9–10.3)
Chloride: 111 mmol/L (ref 98–111)
Creatinine, Ser: 0.92 mg/dL (ref 0.44–1.00)
GFR, Estimated: >60 mL/min (ref >60)
Glucose, Bld: 78 mg/dL (ref 70–99)
Potassium: 3.3 mmol/L — ABNORMAL LOW (ref 3.5–5.1)
Sodium: 140 mmol/L (ref 135–145)
Total Bilirubin: 0.6 mg/dL (ref 0.3–1.2)
Total Protein: 5.7 g/dL — ABNORMAL LOW (ref 6.5–8.1)

## 2022-05-07 LAB — URINALYSIS, ROUTINE W REFLEX MICROSCOPIC
Bilirubin Urine: NEGATIVE
Glucose, UA: NEGATIVE mg/dL
Hgb urine dipstick: NEGATIVE
Ketones, ur: 80 mg/dL — AB
Nitrite: NEGATIVE
Protein, ur: NEGATIVE mg/dL
Specific Gravity, Urine: 1.027 (ref 1.005–1.030)
pH: 5 (ref 5.0–8.0)

## 2022-05-07 LAB — CBC
HCT: 36.4 % (ref 36.0–46.0)
Hemoglobin: 12.4 g/dL (ref 12.0–15.0)
MCH: 30.8 pg (ref 26.0–34.0)
MCHC: 34.1 g/dL (ref 30.0–36.0)
MCV: 90.5 fL (ref 80.0–100.0)
Platelets: 157 10*3/uL (ref 150–400)
RBC: 4.02 MIL/uL (ref 3.87–5.11)
RDW: 11.8 % (ref 11.5–15.5)
WBC: 6.6 10*3/uL (ref 4.0–10.5)
nRBC: 0 % (ref 0.0–0.2)

## 2022-05-07 LAB — HIV ANTIBODY (ROUTINE TESTING W REFLEX): HIV Screen 4th Generation wRfx: NONREACTIVE

## 2022-05-07 SURGERY — EGD (ESOPHAGOGASTRODUODENOSCOPY)
Anesthesia: Monitor Anesthesia Care

## 2022-05-07 MED ORDER — SODIUM CHLORIDE 0.9 % IV SOLN: INTRAVENOUS | Status: DC

## 2022-05-07 MED ORDER — PROPOFOL 10 MG/ML IV BOLUS
INTRAVENOUS | Status: DC | PRN
  Administered 2022-05-07 (×5): 20 mg via INTRAVENOUS

## 2022-05-07 MED ORDER — FENTANYL CITRATE PF 50 MCG/ML IJ SOSY
12.5000 ug | PREFILLED_SYRINGE | INTRAMUSCULAR | Status: DC | PRN
  Administered 2022-05-07: 12.5 ug via INTRAVENOUS
  Filled 2022-05-07: qty 1

## 2022-05-07 MED ORDER — MIDAZOLAM HCL 2 MG/2ML IJ SOLN
INTRAMUSCULAR | Status: DC | PRN
  Administered 2022-05-07: 2 mg via INTRAVENOUS

## 2022-05-07 MED ORDER — LACTATED RINGERS IV SOLN: INTRAVENOUS | Status: AC

## 2022-05-07 MED ORDER — KETOROLAC TROMETHAMINE 15 MG/ML IJ SOLN
15.0000 mg | Freq: Four times a day (QID) | INTRAMUSCULAR | Status: AC | PRN
  Administered 2022-05-07 – 2022-05-09 (×3): 15 mg via INTRAVENOUS
  Filled 2022-05-07 (×3): qty 1

## 2022-05-07 MED ORDER — MIDAZOLAM HCL 2 MG/2ML IJ SOLN
INTRAMUSCULAR | Status: AC
  Filled 2022-05-07: qty 2

## 2022-05-07 MED ORDER — LORAZEPAM 0.5 MG PO TABS
0.5000 mg | ORAL_TABLET | Freq: Four times a day (QID) | ORAL | Status: DC | PRN
  Administered 2022-05-07 – 2022-05-08 (×2): 0.5 mg via ORAL
  Filled 2022-05-07 (×3): qty 1

## 2022-05-07 MED ORDER — POTASSIUM CHLORIDE CRYS ER 20 MEQ PO TBCR
40.0000 meq | EXTENDED_RELEASE_TABLET | Freq: Once | ORAL | Status: AC
  Administered 2022-05-07: 40 meq via ORAL
  Filled 2022-05-07: qty 2

## 2022-05-07 MED ORDER — PROPOFOL 500 MG/50ML IV EMUL
INTRAVENOUS | Status: DC | PRN
  Administered 2022-05-07: 130 ug/kg/min via INTRAVENOUS

## 2022-05-07 NOTE — Transfer of Care (Signed)
Immediate Anesthesia Transfer of Care Note  Patient: Rosana Fret  Procedure(s) Performed: ESOPHAGOGASTRODUODENOSCOPY (EGD)  Patient Location: PACU  Anesthesia Type:MAC  Level of Consciousness: sedated  Airway & Oxygen Therapy: Patient Spontanous Breathing and Patient connected to face mask oxygen  Post-op Assessment: Report given to RN and Post -op Vital signs reviewed and stable  Post vital signs: Reviewed and stable  Last Vitals:  Vitals Value Taken Time  BP    Temp    Pulse 94 05/07/22 1508  Resp 17 05/07/22 1508  SpO2 100 % 05/07/22 1508  Vitals shown include unvalidated device data.  Last Pain:  Vitals:   05/07/22 1443  TempSrc: Temporal  PainSc: 6          Complications: No notable events documented.

## 2022-05-07 NOTE — ED Notes (Signed)
Patient complaining of pain in rt hand at IV site. Cathlon over half way out and meds not infusing. Removed by this nurse. JRPRN

## 2022-05-07 NOTE — Consult Note (Signed)
UNASSIGNED PATIENT Reason for Consult:Abdominal pain. Referring Physician: THP.  Morgan Marshall is an 21 y.o. female.  HPI: Morgan Marshall is a 21 year old female, with a history of GERD, IBS and anxiety disorder and asthma, who presented to the ER with acute epigastric pain that she has had for the last 5 days.  Was seen by [facility] in [place] on 05/05/2022 and had a CT scan of the abdomen pelvis that were unrevealing and was discharged on Protonix Carafate and Zofran.  She had no relief from this medication she presented to the emergency room for further evaluation. She reports having a normal EGD in 2019.  The pain she is experiencing is predominant in the epigastric area and radiates to the back.  This associate with nausea and vomiting that is worse postprandially.  At the present time she is unable to even keep liquids down.  She denies use of any nonsteroidals on a regular basis.  Past Medical History:  Diagnosis Date   Asthma    Chicken pox    GERD (gastroesophageal reflux disease)    Jaundice    Urinary tract infection    Past Surgical History:  Procedure Laterality Date   TYMPANOSTOMY TUBE PLACEMENT     Family History  Problem Relation Age of Onset   Heart attack Mother    Hyperlipidemia Father    Asthma Brother    Arthritis Maternal Grandmother    Asthma Maternal Grandmother    Hyperlipidemia Maternal Grandmother    Hyperlipidemia Maternal Grandfather    Hyperlipidemia Paternal Grandmother    Early death Paternal Grandfather     Social History:  reports that she has never smoked. She has never used smokeless tobacco. She reports that she does not currently use alcohol. She reports that she does not currently use drugs.  Allergies:  Allergies  Allergen Reactions   Latex Hives   Medications: I have reviewed the patient's current medications. Prior to Admission:  Medications Prior to Admission  Medication Sig Dispense Refill Last Dose   albuterol (VENTOLIN HFA) 108  (90 Base) MCG/ACT inhaler INHALE 1-2 PUFFS INTO THE LUNGS EVERY 6 HOURS AS NEEDED. (Patient taking differently: Inhale 1-2 puffs into the lungs every 6 (six) hours as needed for wheezing or shortness of breath.) 18 each 0 more than a  month   busPIRone (BUSPAR) 7.5 MG tablet Take one tab up to three times daily as needed for anxiety. (Patient taking differently: Take 7.5 mg by mouth daily as needed (for anxiety).) 90 tablet 1 05/04/2022   JUNEL FE 1.5/30 1.5-30 MG-MCG tablet TAKE 1 TABLET BY MOUTH EVERY DAY 84 tablet 3 05/06/2022   ondansetron (ZOFRAN-ODT) 4 MG disintegrating tablet Take 4 mg by mouth every 8 (eight) hours as needed for nausea.   05/06/2022 at am   pantoprazole (PROTONIX) 20 MG tablet Take 20 mg by mouth daily.   05/06/2022 at 1330   Probiotic Product (PROBIOTIC DAILY PO) Take by mouth.   05/04/2022   sucralfate (CARAFATE) 1 GM/10ML suspension Take 10 mLs by mouth in the morning, at noon, in the evening, and at bedtime.   05/06/2022 at am   Scheduled:  enoxaparin (LOVENOX) injection  40 mg Subcutaneous Q24H   potassium chloride  40 mEq Oral Once   Continuous:  famotidine (PEPCID) IV 20 mg (05/07/22 0935)   lactated ringers 125 mL/hr at 05/07/22 1610    Results for orders placed or performed during the hospital encounter of 05/06/22 (from the past 48 hour(s))  Urinalysis, Routine w reflex microscopic     Status: Abnormal   Collection Time: 05/06/22  7:26 PM  Result Value Ref Range   Color, Urine YELLOW YELLOW   APPearance CLEAR CLEAR   Specific Gravity, Urine 1.027 1.005 - 1.030   pH 5.0 5.0 - 8.0   Glucose, UA NEGATIVE NEGATIVE mg/dL   Hgb urine dipstick NEGATIVE NEGATIVE   Bilirubin Urine NEGATIVE NEGATIVE   Ketones, ur 80 (A) NEGATIVE mg/dL   Protein, ur NEGATIVE NEGATIVE mg/dL   Nitrite NEGATIVE NEGATIVE   Leukocytes,Ua TRACE (A) NEGATIVE   RBC / HPF 0-5 0 - 5 RBC/hpf   WBC, UA 0-5 0 - 5 WBC/hpf   Bacteria, UA RARE (A) NONE SEEN   Squamous Epithelial / LPF 0-5 0 - 5    Mucus PRESENT     Comment: Performed at [facility], [address] [place], [place] [address]  Lipase, blood     Status: None   Collection Time: 05/06/22  7:26 PM  Result Value Ref Range   Lipase 29 11 - 51 U/L    Comment: Performed at [facility], [address] [place], [place] [address]  CBC with Differential     Status: None   Collection Time: 05/06/22  7:26 PM  Result Value Ref Range   WBC 7.0 4.0 - 10.5 K/uL   RBC 4.35 3.87 - 5.11 MIL/uL   Hemoglobin 13.6 12.0 - 15.0 g/dL   HCT 38.7 56.4 - 33.2 %   MCV 91.0 80.0 - 100.0 fL   MCH 31.3 26.0 - 34.0 pg   MCHC 34.3 30.0 - 36.0 g/dL   RDW 95.1 88.4 - 16.6 %   Platelets 156 150 - 400 K/uL   nRBC 0.0 0.0 - 0.2 %   Neutrophils Relative % 65 %   Neutro Abs 4.5 1.7 - 7.7 K/uL   Lymphocytes Relative 27 %   Lymphs Abs 1.9 0.7 - 4.0 K/uL   Monocytes Relative 5 %   Monocytes Absolute 0.4 0.1 - 1.0 K/uL   Eosinophils Relative 3 %   Eosinophils Absolute 0.2 0.0 - 0.5 K/uL   Basophils Relative 0 %   Basophils Absolute 0.0 0.0 - 0.1 K/uL   Immature Granulocytes 0 %   Abs Immature Granulocytes 0.01 0.00 - 0.07 K/uL    Comment: Performed at [facility], [address], [place], [place] [address]  Comprehensive metabolic panel     Status: Abnormal   Collection Time: 05/06/22  7:26 PM  Result Value Ref Range   Sodium 138 135 - 145 mmol/L   Potassium 3.6 3.5 - 5.1 mmol/L   Chloride 109 98 - 111 mmol/L   CO2 20 (L) 22 - 32 mmol/L   Glucose, Bld 77 70 - 99 mg/dL    Comment: Glucose reference range applies only to samples taken after fasting for at least 8 hours.   BUN 9 6 - 20 mg/dL   Creatinine, Ser 6.01 0.44 - 1.00 mg/dL   Calcium 9.2 8.9 - 09.3 mg/dL   Total Protein 6.9 6.5 - 8.1 g/dL   Albumin 3.9 3.5 - 5.0 g/dL   AST 15 15 - 41 U/L   ALT 17 0 - 44 U/L   Alkaline Phosphatase 36 (L) 38 - 126 U/L   Total Bilirubin 0.5 0.3 - 1.2 mg/dL   GFR, Estimated >23 >55 mL/min     Comment: (NOTE) Calculated using the CKD-EPI Creatinine Equation (2021)  Anion gap 9 5 - 15    Comment: Performed at [facility], [address] [place], [place] [address]  hCG, quantitative, pregnancy     Status: None   Collection Time: 05/06/22  7:26 PM  Result Value Ref Range   hCG, Beta Chain, Quant, S <1 <5 mIU/mL    Comment:          GEST. AGE      CONC.  (mIU/mL)   <=1 WEEK        5 - 50     2 WEEKS       50 - 500     3 WEEKS       100 - 10,000     4 WEEKS     1,000 - 30,000     5 WEEKS     3,500 - 115,000   6-8 WEEKS     12,000 - 270,000    12 WEEKS     15,000 - 220,000        FEMALE AND NON-PREGNANT FEMALE:     LESS THAN 5 mIU/mL Performed at [facility], [address], [place], [place] [address]   Comprehensive metabolic panel     Status: Abnormal   Collection Time: 05/07/22  4:26 AM  Result Value Ref Range   Sodium 140 135 - 145 mmol/L   Potassium 3.3 (L) 3.5 - 5.1 mmol/L   Chloride 111 98 - 111 mmol/L   CO2 20 (L) 22 - 32 mmol/L   Glucose, Bld 78 70 - 99 mg/dL    Comment: Glucose reference range applies only to samples taken after fasting for at least 8 hours.   BUN 9 6 - 20 mg/dL   Creatinine, Ser 4.09 0.44 - 1.00 mg/dL   Calcium 8.8 (L) 8.9 - 10.3 mg/dL   Total Protein 5.7 (L) 6.5 - 8.1 g/dL   Albumin 3.4 (L) 3.5 - 5.0 g/dL   AST 13 (L) 15 - 41 U/L   ALT 13 0 - 44 U/L   Alkaline Phosphatase 32 (L) 38 - 126 U/L   Total Bilirubin 0.6 0.3 - 1.2 mg/dL   GFR, Estimated >73 >53 mL/min    Comment: (NOTE) Calculated using the CKD-EPI Creatinine Equation (2021)    Anion gap 9 5 - 15    Comment: Performed at [facility], [address] [place], [place] [address]  CBC     Status: None   Collection Time: 05/07/22  4:26 AM  Result Value Ref Range   WBC 6.6 4.0 - 10.5 K/uL   RBC 4.02 3.87 - 5.11 MIL/uL   Hemoglobin 12.4 12.0 - 15.0 g/dL   HCT 26.8 34.1 - 96.2 %   MCV 90.5 80.0 - 100.0 fL   MCH 30.8  26.0 - 34.0 pg   MCHC 34.1 30.0 - 36.0 g/dL   RDW 22.9 79.8 - 92.1 %   Platelets 157 150 - 400 K/uL   nRBC 0.0 0.0 - 0.2 %    Comment: Performed at [facility], [address] [place], [place] [address]    DG Chest Portable 1 View  Result Date: 05/06/2022 CLINICAL DATA:  Upper abdominal pain EXAM: PORTABLE CHEST 1 VIEW COMPARISON:  None Available. FINDINGS: The heart size and mediastinal contours are within normal limits. Both lungs are clear. The visualized skeletal structures are unremarkable. No free intraperitoneal gas seen beneath the hemidiaphragms. IMPRESSION: No active disease. Electronically Signed   By:  Helyn Numbers M.D.   On: 05/06/2022 22:29   US Abdomen Limited RUQ (LIVER/GB)  Result Date: 05/06/2022 CLINICAL DATA:  Get epigastric pain EXAM: ULTRASOUND ABDOMEN LIMITED RIGHT UPPER QUADRANT COMPARISON:  None Available. FINDINGS: Gallbladder: No gallstones or wall thickening visualized. No sonographic Murphy sign noted by sonographer. Common bile duct: Diameter: Normal caliber, 2 mm Liver: No focal lesion identified. Within normal limits in parenchymal echogenicity. Portal vein is patent on color Doppler imaging with normal direction of blood flow towards the liver. Other: None. IMPRESSION: No acute findings. Electronically Signed   By: Charlett Nose M.D.   On: 05/06/2022 19:17    Review of Systems  Constitutional: Negative.   HENT: Negative.    Eyes: Negative.   Respiratory: Negative.    Cardiovascular: Negative.   Gastrointestinal:  Positive for abdominal pain, nausea and vomiting. Negative for anal bleeding, blood in stool, constipation, diarrhea and rectal pain.  Endocrine: Negative.   Genitourinary: Negative.   Musculoskeletal: Negative.   Skin: Negative.   Allergic/Immunologic: Negative.   Neurological: Negative.   Hematological: Negative.   Psychiatric/Behavioral:  The patient is nervous/anxious.    Blood pressure 116/70, pulse 83, temperature  98.5 F (36.9 C), resp. rate 18, height 5\' 8"  (1.727 m), weight 104.3 kg, last menstrual period 04/07/2022, SpO2 99 %. Physical Exam Constitutional:      General: She is not in acute distress.    Appearance: She is obese. She is not ill-appearing, toxic-appearing or diaphoretic.  HENT:     Head: Normocephalic and atraumatic.  Cardiovascular:     Rate and Rhythm: Normal rate and regular rhythm.     Heart sounds: Normal heart sounds.  Pulmonary:     Effort: Pulmonary effort is normal.     Breath sounds: Normal breath sounds.  Abdominal:     Palpations: Abdomen is soft.     Tenderness: There is abdominal tenderness in the epigastric area.  Skin:    General: Skin is warm and dry.  Neurological:     General: No focal deficit present.     Mental Status: She is alert and oriented to person, place, and time.  Psychiatric:        Mood and Affect: Mood normal.        Behavior: Behavior normal.   Assessment/Plan: 1) GERD/Epigastric pain with nausea and vomiting-abdominal ultrasound and CT scan were done recently.  Proceed with a EGD at this time.  If this is negative, scan with CCK injection off of all narcotics is recommended to rule out biliary dyskinesia. 2) Morbid obesity. 3) Anxiety disorder on BuSpar. 4) History of asthma. 04/09/2022 05/07/2022, 11:33 AM

## 2022-05-07 NOTE — Progress Notes (Signed)
PROGRESS NOTE    Morgan Marshall  LKG:[id number] DOB: 2001-04-11 DOA: 05/06/2022 PCP: Bary Leriche, PA-C    Brief Narrative:  21 year old female with history of GERD and childhood history of IBS, history of anxiety presented to the emergency room with about 5 days of epigastric pain, retrosternal pain, nausea and unable to tolerate oral diet.  Patient was seen at [facility] in [place] emergency room on 8/21, labs and CT scan abdomen pelvis were reassuring and discharged home with Protonix, Carafate and Zofran.  She did not have any relief so came to emergency room.  Reports EGD in 2019 with GERD. Does have anxiety and she takes BuSpar as needed.  Admitted with significant symptoms.   Assessment & Plan:   Epigastric pain/acute exacerbation of chronic dyspepsia Suspected gastric ulcer disease Suspected biliary dyskinesia  Patient with significant symptoms and intolerance to diet. N.p.o., IV fluids, adequate pain medications, Pepcid 20 mg IV twice daily, nausea medications.  Electrolytes monitoring. Case discussed with gastroenterology, planning for upper GI endoscopy today.  If no adequate explanation of symptoms, will discontinue any narcotics and schedule for HIDA scan tomorrow to look for biliary dyskinesia. Supportive treatment until then.  Hypokalemia: Will replace after endoscopy procedure today.  Anxiety: Uses BuSpar as needed.  We will keep on low-dose Ativan as needed in the hospital and while NPO.   Called and discussed with gastroenterology.  DVT prophylaxis: enoxaparin (LOVENOX) injection 40 mg Start: 05/07/22 2200   Code Status: Full code Family Communication: Mother at bedside Disposition Plan: Status is: Observation The patient will require care spanning > 2 midnights and should be moved to inpatient because: Significant abdominal pain, intolerance to oral intake, needing inpatient surgical procedures.     Consultants:  Gastro surgery, Dr.  Loreta Ave  Procedures:  None  Antimicrobials:  None   Subjective: Patient seen and examined.  Continues to have epigastric pain, no relation to intake.  Nauseated even without eating.  Afebrile.  On room air.  Objective: Vitals:   05/07/22 0433 05/07/22 0500 05/07/22 0801 05/07/22 0900  BP:  (!) 116/59 121/67 116/70  Pulse:  (!) 59 80 83  Resp:  20 17 18   Temp: 98.1 F (36.7 C)  98.5 F (36.9 C)   TempSrc: Oral     SpO2:  99% 99% 99%  Weight:      Height:        Intake/Output Summary (Last 24 hours) at 05/07/2022 1153 Last data filed at 05/06/2022 2313 Gross per 24 hour  Intake 815.95 ml  Output --  Net 815.95 ml   Filed Weights   05/06/22 1756  Weight: 104.3 kg    Examination:  General: Anxious and in mild distress due to pain. Cardiovascular: S1 and S2 normal.  Regular rate rhythm. Respiratory: Bilateral clear.   No added sounds. Gastrointestinal: Soft.  Moderate tenderness in the epigastric region.  No rigidity or guarding.  Bowel sound present. Ext: No swelling or edema.  No cyanosis.  No deformities. Neuro: Nonfocal.     Data Reviewed: I have personally reviewed following labs and imaging studies  CBC: Recent Labs  Lab 05/06/22 1926 05/07/22 0426  WBC 7.0 6.6  NEUTROABS 4.5  --   HGB 13.6 12.4  HCT 39.6 36.4  MCV 91.0 90.5  PLT 156 157   Basic Metabolic Panel: Recent Labs  Lab 05/06/22 1926 05/07/22 0426  NA 138 140  K 3.6 3.3*  CL 109 111  CO2 20* 20*  GLUCOSE 77 78  BUN 9 9  CREATININE 0.89 0.92  CALCIUM 9.2 8.8*   GFR: Estimated Creatinine Clearance: 124.4 mL/min (by C-G formula based on SCr of 0.92 mg/dL). Liver Function Tests: Recent Labs  Lab 05/06/22 1926 05/07/22 0426  AST 15 13*  ALT 17 13  ALKPHOS 36* 32*  BILITOT 0.5 0.6  PROT 6.9 5.7*  ALBUMIN 3.9 3.4*   Recent Labs  Lab 05/06/22 1926  LIPASE 29   No results for input(s): "AMMONIA" in the last 168 hours. Coagulation Profile: No results for input(s): "INR",  "PROTIME" in the last 168 hours. Cardiac Enzymes: No results for input(s): "CKTOTAL", "CKMB", "CKMBINDEX", "TROPONINI" in the last 168 hours. BNP (last 3 results) No results for input(s): "PROBNP" in the last 8760 hours. HbA1C: No results for input(s): "HGBA1C" in the last 72 hours. CBG: No results for input(s): "GLUCAP" in the last 168 hours. Lipid Profile: No results for input(s): "CHOL", "HDL", "LDLCALC", "TRIG", "CHOLHDL", "LDLDIRECT" in the last 72 hours. Thyroid Function Tests: No results for input(s): "TSH", "T4TOTAL", "FREET4", "T3FREE", "THYROIDAB" in the last 72 hours. Anemia Panel: No results for input(s): "VITAMINB12", "FOLATE", "FERRITIN", "TIBC", "IRON", "RETICCTPCT" in the last 72 hours. Sepsis Labs: No results for input(s): "PROCALCITON", "LATICACIDVEN" in the last 168 hours.  No results found for this or any previous visit (from the past 240 hour(s)).       Radiology Studies: DG Chest Portable 1 View  Result Date: 05/06/2022 CLINICAL DATA:  Upper abdominal pain EXAM: PORTABLE CHEST 1 VIEW COMPARISON:  None Available. FINDINGS: The heart size and mediastinal contours are within normal limits. Both lungs are clear. The visualized skeletal structures are unremarkable. No free intraperitoneal gas seen beneath the hemidiaphragms. IMPRESSION: No active disease. Electronically Signed   By: Helyn Numbers M.D.   On: 05/06/2022 22:29   US Abdomen Limited RUQ (LIVER/GB)  Result Date: 05/06/2022 CLINICAL DATA:  Get epigastric pain EXAM: ULTRASOUND ABDOMEN LIMITED RIGHT UPPER QUADRANT COMPARISON:  None Available. FINDINGS: Gallbladder: No gallstones or wall thickening visualized. No sonographic Murphy sign noted by sonographer. Common bile duct: Diameter: Normal caliber, 2 mm Liver: No focal lesion identified. Within normal limits in parenchymal echogenicity. Portal vein is patent on color Doppler imaging with normal direction of blood flow towards the liver. Other: None.  IMPRESSION: No acute findings. Electronically Signed   By: Charlett Nose M.D.   On: 05/06/2022 19:17        Scheduled Meds:  enoxaparin (LOVENOX) injection  40 mg Subcutaneous Q24H   Continuous Infusions:  famotidine (PEPCID) IV 20 mg (05/07/22 0935)   lactated ringers 125 mL/hr at 05/07/22 0934     LOS: 0 days    Time spent: 35 minutes    Dorcas Carrow, MD [facility] Pager [phone]

## 2022-05-07 NOTE — Anesthesia Preprocedure Evaluation (Signed)
Anesthesia Evaluation  Patient identified by MRN, date of birth, ID band Patient awake    Reviewed: Allergy & Precautions, NPO status , Patient's Chart, lab work & pertinent test results  History of Anesthesia Complications Negative for: history of anesthetic complications  Airway Mallampati: I  TM Distance: >3 FB Neck ROM: Full    Dental  (+) Teeth Intact, Dental Advisory Given   Pulmonary neg pulmonary ROS,    breath sounds clear to auscultation       Cardiovascular negative cardio ROS   Rhythm:Regular     Neuro/Psych PSYCHIATRIC DISORDERS Anxiety negative neurological ROS     GI/Hepatic Neg liver ROS, GERD  ,epigastric pain, nausea and vomiting   Endo/Other  negative endocrine ROS  Renal/GU negative Renal ROS     Musculoskeletal negative musculoskeletal ROS (+)   Abdominal   Peds  Hematology negative hematology ROS (+)   Anesthesia Other Findings   Reproductive/Obstetrics No results found for: "PREGTESTUR", "PREGSERUM", "HCG", "HCGQUANT"                              Anesthesia Physical Anesthesia Plan  ASA: 2  Anesthesia Plan: MAC   Post-op Pain Management: Minimal or no pain anticipated   Induction: Intravenous  PONV Risk Score and Plan: 2 and Propofol infusion  Airway Management Planned: Nasal Cannula and Natural Airway  Additional Equipment:   Intra-op Plan:   Post-operative Plan:   Informed Consent: I have reviewed the patients History and Physical, chart, labs and discussed the procedure including the risks, benefits and alternatives for the proposed anesthesia with the patient or authorized representative who has indicated his/her understanding and acceptance.     Dental advisory given  Plan Discussed with: CRNA  Anesthesia Plan Comments:         Anesthesia Quick Evaluation

## 2022-05-07 NOTE — Op Note (Signed)
[facility] Patient Name: Morgan Marshall Procedure Date: 05/07/2022 MRN: [id number] Attending MD: Juanita Craver , MD Date of Birth: 03/18/2001 CSN: [id number] Age: 21 Admit Type: Outpatient Procedure:                Diagnostic EGD. Indications:              Epigastric abdominal pain, Nausea nd vomiting. Providers:                Juanita Craver, MD, Doristine Johns, RN, William Dalton, Technician, [place] Alday CRNA,                            Oleta Mouse, MD Referring MD:             Celso Sickle, PA-C Medicines:                Monitored Anesthesia Care Complications:            No immediate complications. Estimated Blood Loss:     Estimated blood loss: none. Procedure:                Pre-Anesthesia Assessment: - Prior to the                            procedure, a history and physical was performed,                            and patient medications and allergies were                            reviewed. The patient's tolerance of previous                            anesthesia was also reviewed. The risks and                            benefits of the procedure and the sedation options                            and risks were discussed with the patient. All                            questions were answered, and informed consent was                            obtained. Prior Anticoagulants: The patient has                            taken no previous anticoagulant or antiplatelet                            agents. ASA Grade Assessment: I - A normal, healthy  patient. After reviewing the risks and benefits,                            the patient was deemed in satisfactory condition to                            undergo the procedure. After obtaining informed                            consent, the endoscope was passed under direct                            vision. Throughout the procedure, the patient's                             blood pressure, pulse, and oxygen saturations were                            monitored continuously. The [id number]-[id number] ([id number])                            Olympus endoscope was introduced through the mouth,                            and advanced to the second part of duodenum. The                            EGD was accomplished without difficulty. The                            patient tolerated the procedure well. Scope In: Scope Out: Findings:      The examined esophagus and GEJ appeared widely patent and normal; SCJ       was measured at 38 cm.      The entire examined stomach was normal.      The cardia and gastric fundus were normal on retroflexion.      The examined duodenum was normal. Impression:               - Normal apearing esophagus and GEJ.                           - Normal appearing stomach.                           - Normal examined duodenum.                           - No specimens collected. Moderate Sedation:      MAC used. Recommendation:           - Clear liquid diet today.                           - Continue present medications.                           -  Perform a HIDA (hepatobiliary iminodiacetic acid)                            scan with CCK injection ASAP off of all narcotics                            for 12 hours. Procedure Code(s):        --- Professional ---                           [phone], Esophagogastroduodenoscopy, flexible,                            transoral; diagnostic, including collection of                            specimen(s) by brushing or washing, when performed                            (separate procedure) Diagnosis Code(s):        --- Professional ---                           R10.13, Epigastric pain                           R11.2, Nausea with vomiting, unspecified CPT copyright 2019 [employer]. All rights reserved. The codes documented in this report are preliminary and upon coder review may   be revised to meet current compliance requirements. Juanita Craver, MD Juanita Craver, MD 05/07/2022 3:10:37 PM This report has been signed electronically. Number of Addenda: 0

## 2022-05-08 ENCOUNTER — Encounter (HOSPITAL_COMMUNITY): Payer: Self-pay | Admitting: Internal Medicine

## 2022-05-08 ENCOUNTER — Observation Stay (HOSPITAL_COMMUNITY): Payer: BC Managed Care – PPO

## 2022-05-08 DIAGNOSIS — K219 Gastro-esophageal reflux disease without esophagitis: Secondary | ICD-10-CM | POA: Diagnosis present

## 2022-05-08 DIAGNOSIS — K811 Chronic cholecystitis: Secondary | ICD-10-CM | POA: Diagnosis present

## 2022-05-08 DIAGNOSIS — Z825 Family history of asthma and other chronic lower respiratory diseases: Secondary | ICD-10-CM | POA: Diagnosis not present

## 2022-05-08 DIAGNOSIS — Z91040 Latex allergy status: Secondary | ICD-10-CM | POA: Diagnosis not present

## 2022-05-08 DIAGNOSIS — Z8249 Family history of ischemic heart disease and other diseases of the circulatory system: Secondary | ICD-10-CM | POA: Diagnosis not present

## 2022-05-08 DIAGNOSIS — R1010 Upper abdominal pain, unspecified: Secondary | ICD-10-CM | POA: Diagnosis not present

## 2022-05-08 DIAGNOSIS — Z79899 Other long term (current) drug therapy: Secondary | ICD-10-CM | POA: Diagnosis not present

## 2022-05-08 DIAGNOSIS — K589 Irritable bowel syndrome without diarrhea: Secondary | ICD-10-CM | POA: Diagnosis present

## 2022-05-08 DIAGNOSIS — R110 Nausea: Secondary | ICD-10-CM | POA: Diagnosis not present

## 2022-05-08 DIAGNOSIS — R1013 Epigastric pain: Secondary | ICD-10-CM | POA: Diagnosis present

## 2022-05-08 DIAGNOSIS — E876 Hypokalemia: Secondary | ICD-10-CM | POA: Diagnosis present

## 2022-05-08 DIAGNOSIS — R109 Unspecified abdominal pain: Secondary | ICD-10-CM | POA: Diagnosis present

## 2022-05-08 DIAGNOSIS — K820 Obstruction of gallbladder: Secondary | ICD-10-CM | POA: Diagnosis present

## 2022-05-08 DIAGNOSIS — Z6834 Body mass index (BMI) 34.0-34.9, adult: Secondary | ICD-10-CM | POA: Diagnosis not present

## 2022-05-08 DIAGNOSIS — F419 Anxiety disorder, unspecified: Secondary | ICD-10-CM | POA: Diagnosis present

## 2022-05-08 DIAGNOSIS — Z8261 Family history of arthritis: Secondary | ICD-10-CM | POA: Diagnosis not present

## 2022-05-08 DIAGNOSIS — J45909 Unspecified asthma, uncomplicated: Secondary | ICD-10-CM | POA: Diagnosis present

## 2022-05-08 DIAGNOSIS — Z8349 Family history of other endocrine, nutritional and metabolic diseases: Secondary | ICD-10-CM | POA: Diagnosis not present

## 2022-05-08 MED ORDER — LORAZEPAM 0.5 MG PO TABS: 0.5000 mg | ORAL_TABLET | Freq: Four times a day (QID) | ORAL | Status: DC | PRN

## 2022-05-08 MED ORDER — FAMOTIDINE 20 MG PO TABS
20.0000 mg | ORAL_TABLET | Freq: Two times a day (BID) | ORAL | Status: DC
  Administered 2022-05-08: 20 mg via ORAL
  Filled 2022-05-08: qty 1

## 2022-05-08 MED ORDER — SODIUM CHLORIDE 0.9 % IV SOLN: 2.0000 g | INTRAVENOUS | Status: AC

## 2022-05-08 MED ORDER — TECHNETIUM TC 99M MEBROFENIN IV KIT
5.5000 | PACK | Freq: Once | INTRAVENOUS | Status: AC
  Administered 2022-05-08: 5.5 via INTRAVENOUS

## 2022-05-08 MED ORDER — ONDANSETRON 4 MG PO TBDP
4.0000 mg | ORAL_TABLET | Freq: Three times a day (TID) | ORAL | Status: DC | PRN
  Administered 2022-05-08 – 2022-05-09 (×3): 4 mg via ORAL
  Filled 2022-05-08 (×3): qty 1

## 2022-05-08 MED ORDER — LORAZEPAM 2 MG/ML IJ SOLN
0.5000 mg | Freq: Once | INTRAMUSCULAR | Status: AC
  Administered 2022-05-08: 0.5 mg via INTRAVENOUS
  Filled 2022-05-08: qty 1

## 2022-05-08 NOTE — Progress Notes (Signed)
PROGRESS NOTE    Morgan Marshall  LJQ:[id number] DOB: [birth year] DOA: 05/06/2022 PCP: Bary Leriche, PA-C    Brief Narrative:  21 year old female with history of GERD and childhood history of IBS, history of anxiety presented to the emergency room with about 5 days of epigastric pain, retrosternal pain, nausea and unable to tolerate oral diet.  Patient was seen at [facility] in [place] emergency room on 8/21, labs and CT scan abdomen pelvis were reassuring and discharged home with Protonix, Carafate and Zofran.  She did not have any relief so came to emergency room.  Reports EGD in 2019 with GERD. Upper GI endoscopy was normal.  HIDA scan showed severely reduced ejection fraction of 8% post CCK injection.   Assessment & Plan:   Epigastric pain, nausea, intolerance to oral intake. Suspected gastric ulcer disease, ruled out. Suspected biliary dyskinesia, severely reduced gallbladder ejection fraction.  Patient with significant symptoms and intolerance to diet. Continue IV fluids, will challenge with liquid diet today, change Pepcid to oral, adequate nausea medications. Monitor and replace electrolytes. Upper GI endoscopy, normal findings. HIDA scan, severely reduced ejection fraction consistent with severe biliary dysfunction. May benefit with cholecystectomy, surgery consulted.  Hypokalemia: replaced.  Anxiety: Uses BuSpar as needed.  We will keep on low-dose Ativan as needed in the hospital.  I suggested she go on scheduled doses of BuSpar 7.5 mg twice a day after discharge.  Called and discussed with surgery.  DVT prophylaxis: enoxaparin (LOVENOX) injection 40 mg Start: 05/07/22 2200   Code Status: Full code Family Communication: Mother at bedside Disposition Plan: Status is: Observation The patient will require care spanning > 2 midnights and should be moved to inpatient because: Significant abdominal pain, intolerance to oral intake, needing inpatient surgical  procedures.     Consultants:  Gastro surgery, Dr. Loreta Ave [facility] surgery  Procedures:  EGD  Antimicrobials:  None   Subjective:  Patient seen and examined.  Reexamined in the afternoon.  Does have nausea, no vomiting.  Abdominal pain is persistent but some relief today.  Not taking any oral intake yet.  Objective: Vitals:   05/07/22 2256 05/08/22 0241 05/08/22 0657 05/08/22 1326  BP: (!) 124/47 98/60 (!) 125/56 109/67  Pulse: 62 64 (!) 55 81  Resp: 18 16 18 16   Temp:  98.1 F (36.7 C) 98 F (36.7 C) 98 F (36.7 C)  TempSrc:  Oral Oral Oral  SpO2: 100% 99% 100% 100%  Weight:      Height:        Intake/Output Summary (Last 24 hours) at 05/08/2022 1610 Last data filed at 05/08/2022 1400 Gross per 24 hour  Intake 1737.64 ml  Output 2400 ml  Net -662.36 ml   Filed Weights   05/06/22 1756 05/07/22 1443  Weight: 104.3 kg 104.3 kg    Examination:  General: Looks comfortable and talkative today.  In mild distress while in pain and nauseated. Cardiovascular: S1 and S2 normal.  Regular rate rhythm. Respiratory: Bilateral clear.   No added sounds. Gastrointestinal: Soft.  Mild tenderness in the epigastric region.  No rigidity or guarding.  Bowel sound present. Ext: No swelling or edema.  No cyanosis.  No deformities. Neuro: Nonfocal.     Data Reviewed: I have personally reviewed following labs and imaging studies  CBC: Recent Labs  Lab 05/06/22 1926 05/07/22 0426  WBC 7.0 6.6  NEUTROABS 4.5  --   HGB 13.6 12.4  HCT 39.6 36.4  MCV 91.0 90.5  PLT 156 157  Basic Metabolic Panel: Recent Labs  Lab 05/06/22 1926 05/07/22 0426  NA 138 140  K 3.6 3.3*  CL 109 111  CO2 20* 20*  GLUCOSE 77 78  BUN 9 9  CREATININE 0.89 0.92  CALCIUM 9.2 8.8*   GFR: Estimated Creatinine Clearance: 124.4 mL/min (by C-G formula based on SCr of 0.92 mg/dL). Liver Function Tests: Recent Labs  Lab 05/06/22 1926 05/07/22 0426  AST 15 13*  ALT 17 13  ALKPHOS 36* 32*   BILITOT 0.5 0.6  PROT 6.9 5.7*  ALBUMIN 3.9 3.4*   Recent Labs  Lab 05/06/22 1926  LIPASE 29   No results for input(s): "AMMONIA" in the last 168 hours. Coagulation Profile: No results for input(s): "INR", "PROTIME" in the last 168 hours. Cardiac Enzymes: No results for input(s): "CKTOTAL", "CKMB", "CKMBINDEX", "TROPONINI" in the last 168 hours. BNP (last 3 results) No results for input(s): "PROBNP" in the last 8760 hours. HbA1C: No results for input(s): "HGBA1C" in the last 72 hours. CBG: No results for input(s): "GLUCAP" in the last 168 hours. Lipid Profile: No results for input(s): "CHOL", "HDL", "LDLCALC", "TRIG", "CHOLHDL", "LDLDIRECT" in the last 72 hours. Thyroid Function Tests: No results for input(s): "TSH", "T4TOTAL", "FREET4", "T3FREE", "THYROIDAB" in the last 72 hours. Anemia Panel: No results for input(s): "VITAMINB12", "FOLATE", "FERRITIN", "TIBC", "IRON", "RETICCTPCT" in the last 72 hours. Sepsis Labs: No results for input(s): "PROCALCITON", "LATICACIDVEN" in the last 168 hours.  No results found for this or any previous visit (from the past 240 hour(s)).       Radiology Studies: NM Hepato W/EF  Result Date: 05/08/2022 CLINICAL DATA:  CHRONIC UPPER ABDOMINAL PAIN X WEEK NAUSEA X 5 DAYS EXAM: NUCLEAR MEDICINE HEPATOBILIARY IMAGING WITH GALLBLADDER EF TECHNIQUE: Sequential images of the abdomen were obtained out to 60 minutes following intravenous administration of radiopharmaceutical. After oral ingestion of Ensure, gallbladder ejection fraction was determined. At 60 min, normal ejection fraction is greater than 33%. RADIOPHARMACEUTICALS:  5.5 mCi Tc-61m  Choletec IV COMPARISON:  [place]  05/06/2022. FINDINGS: Prompt uptake and biliary excretion of activity by the liver is seen. Gallbladder activity is visualized, consistent with patency of cystic duct. Biliary activity passes into small bowel, consistent with patent common bile duct. Calculated gallbladder ejection  fraction is 8%. (Normal gallbladder ejection fraction with Ensure is greater than 33%.) IMPRESSION: Reduced gallbladder ejection fraction as can be seen with chronic cholecystitis/biliary dyskinesia. Electronically Signed   By: Maudry Mayhew M.D.   On: 05/08/2022 13:41   DG Chest Portable 1 View  Result Date: 05/06/2022 CLINICAL DATA:  Upper abdominal pain EXAM: PORTABLE CHEST 1 VIEW COMPARISON:  None Available. FINDINGS: The heart size and mediastinal contours are within normal limits. Both lungs are clear. The visualized skeletal structures are unremarkable. No free intraperitoneal gas seen beneath the hemidiaphragms. IMPRESSION: No active disease. Electronically Signed   By: Helyn Numbers M.D.   On: 05/06/2022 22:29   US Abdomen Limited RUQ (LIVER/GB)  Result Date: 05/06/2022 CLINICAL DATA:  Get epigastric pain EXAM: ULTRASOUND ABDOMEN LIMITED RIGHT UPPER QUADRANT COMPARISON:  None Available. FINDINGS: Gallbladder: No gallstones or wall thickening visualized. No sonographic Murphy sign noted by sonographer. Common bile duct: Diameter: Normal caliber, 2 mm Liver: No focal lesion identified. Within normal limits in parenchymal echogenicity. Portal vein is patent on color Doppler imaging with normal direction of blood flow towards the liver. Other: None. IMPRESSION: No acute findings. Electronically Signed   By: Charlett Nose M.D.   On: 05/06/2022 19:17  Scheduled Meds:  enoxaparin (LOVENOX) injection  40 mg Subcutaneous Q24H   Continuous Infusions:  famotidine (PEPCID) IV 20 mg (05/08/22 0917)   lactated ringers 125 mL/hr at 05/08/22 0918     LOS: 0 days    Time spent: 35 minutes    Dorcas Carrow, MD [facility] Pager [phone]

## 2022-05-08 NOTE — Progress Notes (Signed)
Transition of Care [facility]) Screening Note  Patient Details  Name: Morgan Marshall Date of Birth: [birth year]  Transition of Care [facility]) CM/SW Contact:    Ewing Schlein, LCSW Phone Number: 05/08/2022, 11:09 AM  Transition of Care Department [facility]) has reviewed patient and no TOC needs have been identified at this time. We will continue to monitor patient advancement through interdisciplinary progression rounds. If new patient transition needs arise, please place a TOC consult.

## 2022-05-08 NOTE — Consult Note (Signed)
Morgan Marshall 01/17/2001  JL:[id number].    Requesting MD: Dr. Barb Merino Chief Complaint/Reason for Consult: epigastric abdominal pain  HPI:  This is a 21 yo female with a history of GERD, IBS and anxiety who presented to the ED 2 days ago with a several day history of epigastric abdominal pain that was sharp in nature and radiating to her back.  She did have nausea and vomiting.  It persisted and worsened just prior to admission and she was unable to eat or drink.  She presented to the ED where she had an [place] that was normal.  GI was asked to see her due to her history of GERD.  She underwent an EGD by Dr. Collene Mares yesterday that was also negative.  Her labs are reassuringly negative as well.  She then underwent a HIDA scan with an EF which revealed a patent cystic duct with no evidence of cholecystitis, but an EF of 8%.  She had worsening symptoms with CCK injection and with drinking Ensure.  We have been asked to see her for evaluation for biliary dyskinesia.  She does report a history over the past few months of abdominal discomfort postprandially that is not the same as her IBS symptoms  ROS: ROS: see HPI  Family History  Problem Relation Age of Onset   Heart attack Mother    Hyperlipidemia Father    Asthma Brother    Arthritis Maternal Grandmother    Asthma Maternal Grandmother    Hyperlipidemia Maternal Grandmother    Hyperlipidemia Maternal Grandfather    Hyperlipidemia Paternal Grandmother    Early death Paternal Grandfather     Past Medical History:  Diagnosis Date   Asthma    Chicken pox    GERD (gastroesophageal reflux disease)    Jaundice    Urinary tract infection     Past Surgical History:  Procedure Laterality Date   TYMPANOSTOMY TUBE PLACEMENT      Social History:  reports that she has never smoked. She has never used smokeless tobacco. She reports that she does not currently use alcohol. She reports that she does not currently use drugs.  Allergies:   Allergies  Allergen Reactions   Latex Hives    Medications Prior to Admission  Medication Sig Dispense Refill   albuterol (VENTOLIN HFA) 108 (90 Base) MCG/ACT inhaler INHALE 1-2 PUFFS INTO THE LUNGS EVERY 6 HOURS AS NEEDED. (Patient taking differently: Inhale 1-2 puffs into the lungs every 6 (six) hours as needed for wheezing or shortness of breath.) 18 each 0   busPIRone (BUSPAR) 7.5 MG tablet Take one tab up to three times daily as needed for anxiety. (Patient taking differently: Take 7.5 mg by mouth daily as needed (for anxiety).) 90 tablet 1   JUNEL FE 1.5/30 1.5-30 MG-MCG tablet TAKE 1 TABLET BY MOUTH EVERY DAY 84 tablet 3   ondansetron (ZOFRAN-ODT) 4 MG disintegrating tablet Take 4 mg by mouth every 8 (eight) hours as needed for nausea.     pantoprazole (PROTONIX) 20 MG tablet Take 20 mg by mouth daily.     Probiotic Product (PROBIOTIC DAILY PO) Take by mouth.     sucralfate (CARAFATE) 1 GM/10ML suspension Take 10 mLs by mouth in the morning, at noon, in the evening, and at bedtime.       Physical Exam: Blood pressure 109/67, pulse 81, temperature 98 F (36.7 C), temperature source Oral, resp. rate 16, height 5\' 8"  (1.727 m), weight 104.3 kg, last menstrual period 04/07/2022,  SpO2 100 %. General: pleasant, WD, WN female who is laying in bed in NAD HEENT: head is normocephalic, atraumatic.  Sclera are noninjected.  PERRL.  Ears and nose without any masses or lesions.  Mouth is pink and moist Heart: regular, rate, and rhythm.  Normal s1,s2. No obvious murmurs, gallops, or rubs noted.  Palpable radial and pedal pulses bilaterally Lungs: CTAB, no wheezes, rhonchi, or rales noted.  Respiratory effort nonlabored Abd: soft, ND, +BS, no masses, hernias, or organomegaly.  Localized TTP right upper quadrant MS: all 4 extremities are symmetrical with no cyanosis, clubbing, or edema. Skin: warm and dry with no masses, lesions, or rashes Neuro: Cranial nerves 2-12 grossly intact, sensation is  normal throughout Psych: A&Ox3 with an appropriate affect.   Results for orders placed or performed during the hospital encounter of 05/06/22 (from the past 48 hour(s))  Urinalysis, Routine w reflex microscopic     Status: Abnormal   Collection Time: 05/06/22  7:26 PM  Result Value Ref Range   Color, Urine YELLOW YELLOW   APPearance CLEAR CLEAR   Specific Gravity, Urine 1.027 1.005 - 1.030   pH 5.0 5.0 - 8.0   Glucose, UA NEGATIVE NEGATIVE mg/dL   Hgb urine dipstick NEGATIVE NEGATIVE   Bilirubin Urine NEGATIVE NEGATIVE   Ketones, ur 80 (A) NEGATIVE mg/dL   Protein, ur NEGATIVE NEGATIVE mg/dL   Nitrite NEGATIVE NEGATIVE   Leukocytes,Ua TRACE (A) NEGATIVE   RBC / HPF 0-5 0 - 5 RBC/hpf   WBC, UA 0-5 0 - 5 WBC/hpf   Bacteria, UA RARE (A) NONE SEEN   Squamous Epithelial / LPF 0-5 0 - 5   Mucus PRESENT     Comment: Performed at [facility], [place] [address], [place], [place] [address]  Lipase, blood     Status: None   Collection Time: 05/06/22  7:26 PM  Result Value Ref Range   Lipase 29 11 - 51 U/L    Comment: Performed at [facility], [place] [address] [place], [place] [address]  CBC with Differential     Status: None   Collection Time: 05/06/22  7:26 PM  Result Value Ref Range   WBC 7.0 4.0 - 10.5 K/uL   RBC 4.35 3.87 - 5.11 MIL/uL   Hemoglobin 13.6 12.0 - 15.0 g/dL   HCT 39.6 36.0 - 46.0 %   MCV 91.0 80.0 - 100.0 fL   MCH 31.3 26.0 - 34.0 pg   MCHC 34.3 30.0 - 36.0 g/dL   RDW 11.7 11.5 - 15.5 %   Platelets 156 150 - 400 K/uL   nRBC 0.0 0.0 - 0.2 %   Neutrophils Relative % 65 %   Neutro Abs 4.5 1.7 - 7.7 K/uL   Lymphocytes Relative 27 %   Lymphs Abs 1.9 0.7 - 4.0 K/uL   Monocytes Relative 5 %   Monocytes Absolute 0.4 0.1 - 1.0 K/uL   Eosinophils Relative 3 %   Eosinophils Absolute 0.2 0.0 - 0.5 K/uL   Basophils Relative 0 %   Basophils Absolute 0.0 0.0 - 0.1 K/uL   Immature Granulocytes 0 %   Abs Immature Granulocytes 0.01 0.00 -  0.07 K/uL    Comment: Performed at [facility], [place] [address] [place], [place] [address]  Comprehensive metabolic panel     Status: Abnormal   Collection Time: 05/06/22  7:26 PM  Result Value Ref Range   Sodium 138 135 - 145 mmol/L   Potassium 3.6  3.5 - 5.1 mmol/L   Chloride 109 98 - 111 mmol/L   CO2 20 (L) 22 - 32 mmol/L   Glucose, Bld 77 70 - 99 mg/dL    Comment: Glucose reference range applies only to samples taken after fasting for at least 8 hours.   BUN 9 6 - 20 mg/dL   Creatinine, Ser 4.65 0.44 - 1.00 mg/dL   Calcium 9.2 8.9 - 68.1 mg/dL   Total Protein 6.9 6.5 - 8.1 g/dL   Albumin 3.9 3.5 - 5.0 g/dL   AST 15 15 - 41 U/L   ALT 17 0 - 44 U/L   Alkaline Phosphatase 36 (L) 38 - 126 U/L   Total Bilirubin 0.5 0.3 - 1.2 mg/dL   GFR, Estimated >27 >51 mL/min    Comment: (NOTE) Calculated using the CKD-EPI Creatinine Equation (2021)    Anion gap 9 5 - 15    Comment: Performed at [facility], [address], [place], [place] [address]  hCG, quantitative, pregnancy     Status: None   Collection Time: 05/06/22  7:26 PM  Result Value Ref Range   hCG, Beta Chain, Quant, S <1 <5 mIU/mL    Comment:          GEST. AGE      CONC.  (mIU/mL)   <=1 WEEK        5 - 50     2 WEEKS       50 - 500     3 WEEKS       100 - 10,000     4 WEEKS     1,000 - 30,000     5 WEEKS     3,500 - 115,000   6-8 WEEKS     12,000 - 270,000    12 WEEKS     15,000 - 220,000        FEMALE AND NON-PREGNANT FEMALE:     LESS THAN 5 mIU/mL Performed at [facility], [address] [place], [place] [address]   HIV Antibody (routine testing w rflx)     Status: None   Collection Time: 05/07/22  4:26 AM  Result Value Ref Range   HIV Screen 4th Generation wRfx Non Reactive Non Reactive    Comment: Performed at [facility], [address], [place], [place] [address]  Comprehensive metabolic panel     Status: Abnormal   Collection Time: 05/07/22   4:26 AM  Result Value Ref Range   Sodium 140 135 - 145 mmol/L   Potassium 3.3 (L) 3.5 - 5.1 mmol/L   Chloride 111 98 - 111 mmol/L   CO2 20 (L) 22 - 32 mmol/L   Glucose, Bld 78 70 - 99 mg/dL    Comment: Glucose reference range applies only to samples taken after fasting for at least 8 hours.   BUN 9 6 - 20 mg/dL   Creatinine, Ser 6.38 0.44 - 1.00 mg/dL   Calcium 8.8 (L) 8.9 - 10.3 mg/dL   Total Protein 5.7 (L) 6.5 - 8.1 g/dL   Albumin 3.4 (L) 3.5 - 5.0 g/dL   AST 13 (L) 15 - 41 U/L   ALT 13 0 - 44 U/L   Alkaline Phosphatase 32 (L) 38 - 126 U/L   Total Bilirubin 0.6 0.3 - 1.2 mg/dL   GFR, Estimated >46 >65 mL/min    Comment: (NOTE) Calculated using the CKD-EPI Creatinine Equation (2021)    Anion gap 9 5 -  15    Comment: Performed at [facility], [address], [place], [place] [address]  CBC     Status: None   Collection Time: 05/07/22  4:26 AM  Result Value Ref Range   WBC 6.6 4.0 - 10.5 K/uL   RBC 4.02 3.87 - 5.11 MIL/uL   Hemoglobin 12.4 12.0 - 15.0 g/dL   HCT 52.7 78.2 - 42.3 %   MCV 90.5 80.0 - 100.0 fL   MCH 30.8 26.0 - 34.0 pg   MCHC 34.1 30.0 - 36.0 g/dL   RDW 53.6 14.4 - 31.5 %   Platelets 157 150 - 400 K/uL   nRBC 0.0 0.0 - 0.2 %    Comment: Performed at [facility], [address] [place], [place] [address]   NM Hepato W/EF  Result Date: 05/08/2022 CLINICAL DATA:  CHRONIC UPPER ABDOMINAL PAIN X WEEK NAUSEA X 5 DAYS EXAM: NUCLEAR MEDICINE HEPATOBILIARY IMAGING WITH GALLBLADDER EF TECHNIQUE: Sequential images of the abdomen were obtained out to 60 minutes following intravenous administration of radiopharmaceutical. After oral ingestion of Ensure, gallbladder ejection fraction was determined. At 60 min, normal ejection fraction is greater than 33%. RADIOPHARMACEUTICALS:  5.5 mCi Tc-83m  Choletec IV COMPARISON:  [place]  05/06/2022. FINDINGS: Prompt uptake and biliary excretion of activity by the liver is seen. Gallbladder activity is  visualized, consistent with patency of cystic duct. Biliary activity passes into small bowel, consistent with patent common bile duct. Calculated gallbladder ejection fraction is 8%. (Normal gallbladder ejection fraction with Ensure is greater than 33%.) IMPRESSION: Reduced gallbladder ejection fraction as can be seen with chronic cholecystitis/biliary dyskinesia. Electronically Signed   By: Maudry Mayhew M.D.   On: 05/08/2022 13:41   DG Chest Portable 1 View  Result Date: 05/06/2022 CLINICAL DATA:  Upper abdominal pain EXAM: PORTABLE CHEST 1 VIEW COMPARISON:  None Available. FINDINGS: The heart size and mediastinal contours are within normal limits. Both lungs are clear. The visualized skeletal structures are unremarkable. No free intraperitoneal gas seen beneath the hemidiaphragms. IMPRESSION: No active disease. Electronically Signed   By: Helyn Numbers M.D.   On: 05/06/2022 22:29   US Abdomen Limited RUQ (LIVER/GB)  Result Date: 05/06/2022 CLINICAL DATA:  Get epigastric pain EXAM: ULTRASOUND ABDOMEN LIMITED RIGHT UPPER QUADRANT COMPARISON:  None Available. FINDINGS: Gallbladder: No gallstones or wall thickening visualized. No sonographic Murphy sign noted by sonographer. Common bile duct: Diameter: Normal caliber, 2 mm Liver: No focal lesion identified. Within normal limits in parenchymal echogenicity. Portal vein is patent on color Doppler imaging with normal direction of blood flow towards the liver. Other: None. IMPRESSION: No acute findings. Electronically Signed   By: Charlett Nose M.D.   On: 05/06/2022 19:17      Assessment/Plan Biliary Dyskinesia The patient has been seen and examined, chart, labs, vitals, imaging all personally reviewed.  She appears to have biliary dyskinesia.  We will likely plan to proceed with lap chole while she is here; however, we have discussed that it is possible that this may not help.  She understands and agrees to proceed.  Plan for lap chole tomorrow OR time  allowing.    FEN -CLD, n.p.o. midnight VTE -SCDs ID -perioperative antibiotics  I reviewed Consultant gastroenterology notes, hospitalist notes, last 24 h vitals and pain scores, last 48 h intake and output, last 24 h labs and trends, and last 24 h imaging results.  Carl Best, [facility] 05/08/2022, 3:01 PM Please see Amion for pager  number during day hours 7:00am-4:30pm or 7:00am -11:30am on weekends

## 2022-05-08 NOTE — Anesthesia Postprocedure Evaluation (Signed)
Anesthesia Post Note  Patient: Morgan Marshall  Procedure(s) Performed: ESOPHAGOGASTRODUODENOSCOPY (EGD)     Patient location during evaluation: Endoscopy Anesthesia Type: MAC Level of consciousness: awake and alert Pain management: pain level controlled Vital Signs Assessment: post-procedure vital signs reviewed and stable Respiratory status: spontaneous breathing, nonlabored ventilation and respiratory function stable Cardiovascular status: stable and blood pressure returned to baseline Postop Assessment: no apparent nausea or vomiting Anesthetic complications: no   No notable events documented.  Last Vitals:  Vitals:   05/08/22 0657 05/08/22 1326  BP: (!) 125/56 109/67  Pulse: (!) 55 81  Resp: 18 16  Temp: 36.7 C 36.7 C  SpO2: 100% 100%    Last Pain:  Vitals:   05/08/22 1326  TempSrc: Oral  PainSc:                  [name]

## 2022-05-09 DIAGNOSIS — R1013 Epigastric pain: Secondary | ICD-10-CM | POA: Diagnosis not present

## 2022-05-09 MED ORDER — FAMOTIDINE IN NACL 20-0.9 MG/50ML-% IV SOLN
20.0000 mg | Freq: Two times a day (BID) | INTRAVENOUS | Status: DC
Start: 2022-05-09 — End: 2022-05-10
  Administered 2022-05-09 – 2022-05-10 (×3): 20 mg via INTRAVENOUS
  Filled 2022-05-09 (×3): qty 50

## 2022-05-09 NOTE — Progress Notes (Signed)
Progress Note  2 Days Post-Op  Subjective: CC: Still having abdominal pain and nausea.  This was worsened by trying to take some oral medications last night.  Last BM yesterday.  No other complaints   Objective: Vital signs in last 24 hours: Temp:  [98 F (36.7 C)-98.6 F (37 C)] 98.3 F (36.8 C) (08/25 1210) Pulse Rate:  [55-81] 55 (08/25 1210) Resp:  [16-20] 20 (08/25 0532) BP: (109-118)/(63-73) 118/73 (08/25 1210) SpO2:  [98 %-100 %] 100 % (08/25 1210) Last BM Date : 05/08/22  Intake/Output from previous day: 08/24 0701 - 08/25 0700 In: 4268.5 [P.O.:630; I.V.:3588.6; IV Piggyback:49.9] Out: 2100 [Urine:2100] Intake/Output this shift: No intake/output data recorded.  PE: General: pleasant, WD, WN female who is laying in bed in NAD HEENT: head is normocephalic, atraumatic.  Sclera are noninjected.  Lungs:  Respiratory effort nonlabored Abd: soft, ND, +BS, no masses, hernias, or organomegaly.  Localized TTP right upper quadrant and epigastrium MS: all 4 extremities are symmetrical with no cyanosis, clubbing, or edema. Skin: warm and dry with no masses, lesions, or rashes  Lab Results:  Recent Labs    05/06/22 1926 05/07/22 0426  WBC 7.0 6.6  HGB 13.6 12.4  HCT 39.6 36.4  PLT 156 157   BMET Recent Labs    05/06/22 1926 05/07/22 0426  NA 138 140  K 3.6 3.3*  CL 109 111  CO2 20* 20*  GLUCOSE 77 78  BUN 9 9  CREATININE 0.89 0.92  CALCIUM 9.2 8.8*   PT/INR No results for input(s): "LABPROT", "INR" in the last 72 hours. CMP     Component Value Date/Time   NA 140 05/07/2022 0426   K 3.3 (L) 05/07/2022 0426   CL 111 05/07/2022 0426   CO2 20 (L) 05/07/2022 0426   GLUCOSE 78 05/07/2022 0426   BUN 9 05/07/2022 0426   CREATININE 0.92 05/07/2022 0426   CALCIUM 8.8 (L) 05/07/2022 0426   PROT 5.7 (L) 05/07/2022 0426   ALBUMIN 3.4 (L) 05/07/2022 0426   AST 13 (L) 05/07/2022 0426   ALT 13 05/07/2022 0426   ALKPHOS 32 (L) 05/07/2022 0426   BILITOT 0.6  05/07/2022 0426   GFRNONAA >60 05/07/2022 0426   Lipase     Component Value Date/Time   LIPASE 29 05/06/2022 1926       Studies/Results: NM Hepato W/EF  Result Date: 05/08/2022 CLINICAL DATA:  CHRONIC UPPER ABDOMINAL PAIN X WEEK NAUSEA X 5 DAYS EXAM: NUCLEAR MEDICINE HEPATOBILIARY IMAGING WITH GALLBLADDER EF TECHNIQUE: Sequential images of the abdomen were obtained out to 60 minutes following intravenous administration of radiopharmaceutical. After oral ingestion of Ensure, gallbladder ejection fraction was determined. At 60 min, normal ejection fraction is greater than 33%. RADIOPHARMACEUTICALS:  5.5 mCi Tc-21m  Choletec IV COMPARISON:  [place]  05/06/2022. FINDINGS: Prompt uptake and biliary excretion of activity by the liver is seen. Gallbladder activity is visualized, consistent with patency of cystic duct. Biliary activity passes into small bowel, consistent with patent common bile duct. Calculated gallbladder ejection fraction is 8%. (Normal gallbladder ejection fraction with Ensure is greater than 33%.) IMPRESSION: Reduced gallbladder ejection fraction as can be seen with chronic cholecystitis/biliary dyskinesia. Electronically Signed   By: Maudry Mayhew M.D.   On: 05/08/2022 13:41    Anti-infectives: Anti-infectives (From admission, onward)    Start     Dose/Rate Route Frequency Ordered Stop   05/09/22 0600  cefTRIAXone (ROCEPHIN) 2 g in sodium chloride 0.9 % 100 mL IVPB  2 g 200 mL/hr over 30 Minutes Intravenous On call to O.R. 05/08/22 1617 05/10/22 0559        Assessment/Plan Biliary dyskinesia -Still with abdominal pain.  She is having some abdominal discomfort with medications which were transitioned to p.o. yesterday -No concerning signs/symptoms of cholecystitis -OR availability precludes [place] doing a lap chole today.  Patient can resume a clear liquid diet and n.p.o. at midnight for lap chole tomorrow  FEN: CLD, n.p.o. midnight ID: Ceftriaxone perioperative VTE:  Lovenox  I reviewed last 24 h vitals and pain scores, last 48 h intake and output, last 24 h labs and trends, and last 24 h imaging results.    LOS: 1 day   Eric Form, [facility] 05/09/2022, 1:24 PM Please see Amion for pager number during day hours 7:00am-4:30pm

## 2022-05-09 NOTE — H&P (View-Only) (Signed)
 Progress Note  2 Days Post-Op  Subjective: CC: Still having abdominal pain and nausea.  This was worsened by trying to take some oral medications last night.  Last BM yesterday.  No other complaints   Objective: Vital signs in last 24 hours: Temp:  [98 F (36.7 C)-98.6 F (37 C)] 98.3 F (36.8 C) (08/25 1210) Pulse Rate:  [55-81] 55 (08/25 1210) Resp:  [16-20] 20 (08/25 0532) BP: (109-118)/(63-73) 118/73 (08/25 1210) SpO2:  [98 %-100 %] 100 % (08/25 1210) Last BM Date : 05/08/22  Intake/Output from previous day: 08/24 0701 - 08/25 0700 In: 4268.5 [P.O.:630; I.V.:3588.6; IV Piggyback:49.9] Out: 2100 [Urine:2100] Intake/Output this shift: No intake/output data recorded.  PE: General: pleasant, WD, WN female who is laying in bed in NAD HEENT: head is normocephalic, atraumatic.  Sclera are noninjected.  Lungs:  Respiratory effort nonlabored Abd: soft, ND, +BS, no masses, hernias, or organomegaly.  Localized TTP right upper quadrant and epigastrium MS: all 4 extremities are symmetrical with no cyanosis, clubbing, or edema. Skin: warm and dry with no masses, lesions, or rashes  Lab Results:  Recent Labs    05/06/22 1926 05/07/22 0426  WBC 7.0 6.6  HGB 13.6 12.4  HCT 39.6 36.4  PLT 156 157   BMET Recent Labs    05/06/22 1926 05/07/22 0426  NA 138 140  K 3.6 3.3*  CL 109 111  CO2 20* 20*  GLUCOSE 77 78  BUN 9 9  CREATININE 0.89 0.92  CALCIUM 9.2 8.8*   PT/INR No results for input(s): "LABPROT", "INR" in the last 72 hours. CMP     Component Value Date/Time   NA 140 05/07/2022 0426   K 3.3 (L) 05/07/2022 0426   CL 111 05/07/2022 0426   CO2 20 (L) 05/07/2022 0426   GLUCOSE 78 05/07/2022 0426   BUN 9 05/07/2022 0426   CREATININE 0.92 05/07/2022 0426   CALCIUM 8.8 (L) 05/07/2022 0426   PROT 5.7 (L) 05/07/2022 0426   ALBUMIN 3.4 (L) 05/07/2022 0426   AST 13 (L) 05/07/2022 0426   ALT 13 05/07/2022 0426   ALKPHOS 32 (L) 05/07/2022 0426   BILITOT 0.6  05/07/2022 0426   GFRNONAA >60 05/07/2022 0426   Lipase     Component Value Date/Time   LIPASE 29 05/06/2022 1926       Studies/Results: NM Hepato W/EF  Result Date: 05/08/2022 CLINICAL DATA:  CHRONIC UPPER ABDOMINAL PAIN X WEEK NAUSEA X 5 DAYS EXAM: NUCLEAR MEDICINE HEPATOBILIARY IMAGING WITH GALLBLADDER EF TECHNIQUE: Sequential images of the abdomen were obtained out to 60 minutes following intravenous administration of radiopharmaceutical. After oral ingestion of Ensure, gallbladder ejection fraction was determined. At 60 min, normal ejection fraction is greater than 33%. RADIOPHARMACEUTICALS:  5.5 mCi Tc-99m  Choletec IV COMPARISON:  [place]  05/06/2022. FINDINGS: Prompt uptake and biliary excretion of activity by the liver is seen. Gallbladder activity is visualized, consistent with patency of cystic duct. Biliary activity passes into small bowel, consistent with patent common bile duct. Calculated gallbladder ejection fraction is 8%. (Normal gallbladder ejection fraction with Ensure is greater than 33%.) IMPRESSION: Reduced gallbladder ejection fraction as can be seen with chronic cholecystitis/biliary dyskinesia. Electronically Signed   By: Jeffrey  Waltz M.D.   On: 05/08/2022 13:41    Anti-infectives: Anti-infectives (From admission, onward)    Start     Dose/Rate Route Frequency Ordered Stop   05/09/22 0600  cefTRIAXone (ROCEPHIN) 2 g in sodium chloride 0.9 % 100 mL IVPB          2 g 200 mL/hr over 30 Minutes Intravenous On call to O.R. 05/08/22 1617 05/10/22 0559        Assessment/Plan Biliary dyskinesia -Still with abdominal pain.  She is having some abdominal discomfort with medications which were transitioned to p.o. yesterday -No concerning signs/symptoms of cholecystitis -OR availability precludes [place] doing a lap chole today.  Patient can resume a clear liquid diet and n.p.o. at midnight for lap chole tomorrow  FEN: CLD, n.p.o. midnight ID: Ceftriaxone perioperative VTE:  Lovenox  I reviewed last 24 h vitals and pain scores, last 48 h intake and output, last 24 h labs and trends, and last 24 h imaging results.    LOS: 1 day   Eric Form, [facility] 05/09/2022, 1:24 PM Please see Amion for pager number during day hours 7:00am-4:30pm

## 2022-05-09 NOTE — Progress Notes (Signed)
PROGRESS NOTE    Morgan Marshall  DDU:[id number]  DOB: 08-04-2001  DOA: 05/06/2022 PCP: Bary Leriche, PA-C Outpatient Specialists:   Hospital course:  21 year old female with history of GERD and childhood history of IBS, history of anxiety presented to the emergency room with about 5 days of epigastric pain, retrosternal pain, nausea and unable to tolerate oral diet.  Patient was seen at [facility] in [place] emergency room on 8/21, labs and CT scan abdomen pelvis were reassuring and discharged home with Protonix, Carafate and Zofran.  She did not have any relief so came to emergency room.  Reports EGD in 2019 with GERD. Upper GI endoscopy was normal.  HIDA scan showed severely reduced ejection fraction of 8% post CCK injection.     Subjective:  Patient states she is doing okay.  Understand she is scheduled for cholecystectomy later on today and is agreeable.   Objective: Vitals:   05/08/22 1326 05/08/22 2152 05/09/22 0532 05/09/22 1210  BP: 109/67 110/63 115/67 118/73  Pulse: 81 (!) 56 67 (!) 55  Resp: 16 16 20    Temp: 98 F (36.7 C) 98.5 F (36.9 C) 98.6 F (37 C) 98.3 F (36.8 C)  TempSrc: Oral Oral Oral Oral  SpO2: 100% 100% 98% 100%  Weight:      Height:        Intake/Output Summary (Last 24 hours) at 05/09/2022 1303 Last data filed at 05/09/2022 1031 Gross per 24 hour  Intake 4268.46 ml  Output 1800 ml  Net 2468.46 ml   Filed Weights   05/06/22 1756 05/07/22 1443  Weight: 104.3 kg 104.3 kg     Exam:  General: Obese patient sitting up in bed appearing comfortable in NAD, speaking in full sentences without difficulty Eyes: sclera anicteric, conjuctiva mild injection bilaterally CVS: S1-S2, regular  Respiratory:  decreased air entry bilaterally secondary to decreased inspiratory effort, rales at bases  GI: NABS, soft, minimal tenderness epigastric area, Murphy's is negative. LE: No edema.  Neuro: A/O x 3, Moving all extremities equally with  normal strength, CN 3-12 intact, grossly nonfocal.  Psych: patient is logical and coherent, judgement and insight appear normal, mood and affect appropriate to situation.   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 Patient has undergone EGD which was normal Plan is for cholecystectomy later today Advance diet as tolerated post CCY  Hypokalemia Normalized with repletion  Anxiety Continue present management   DVT prophylaxis: Lovenox Code Status: Full Family Communication: None Disposition Plan:   Patient is from: Home  Anticipated Discharge Location: Home  Barriers to Discharge: For CCY today  Is patient medically stable for Discharge: Not yet   Scheduled Meds:  enoxaparin (LOVENOX) injection  40 mg Subcutaneous Q24H   Continuous Infusions:  cefTRIAXone (ROCEPHIN)  IV     famotidine (PEPCID) IV 20 mg (05/09/22 1009)    Data Reviewed:  Basic Metabolic Panel: Recent Labs  Lab 05/06/22 1926 05/07/22 0426  NA 138 140  K 3.6 3.3*  CL 109 111  CO2 20* 20*  GLUCOSE 77 78  BUN 9 9  CREATININE 0.89 0.92  CALCIUM 9.2 8.8*    CBC: Recent Labs  Lab 05/06/22 1926 05/07/22 0426  WBC 7.0 6.6  NEUTROABS 4.5  --   HGB 13.6 12.4  HCT 39.6 36.4  MCV 91.0 90.5  PLT 156 157    Studies: NM Hepato W/EF  Result Date: 05/08/2022 CLINICAL DATA:  CHRONIC UPPER ABDOMINAL PAIN  X WEEK NAUSEA X 5 DAYS EXAM: NUCLEAR MEDICINE HEPATOBILIARY IMAGING WITH GALLBLADDER EF TECHNIQUE: Sequential images of the abdomen were obtained out to 60 minutes following intravenous administration of radiopharmaceutical. After oral ingestion of Ensure, gallbladder ejection fraction was determined. At 60 min, normal ejection fraction is greater than 33%. RADIOPHARMACEUTICALS:  5.5 mCi Tc-34m  Choletec IV COMPARISON:  [place]  05/06/2022. FINDINGS: Prompt uptake and biliary excretion of activity by the liver is seen. Gallbladder activity is visualized, consistent with patency  of cystic duct. Biliary activity passes into small bowel, consistent with patent common bile duct. Calculated gallbladder ejection fraction is 8%. (Normal gallbladder ejection fraction with Ensure is greater than 33%.) IMPRESSION: Reduced gallbladder ejection fraction as can be seen with chronic cholecystitis/biliary dyskinesia. Electronically Signed   By: Maudry Mayhew M.D.   On: 05/08/2022 13:41    Principal Problem:   Epigastric pain Active Problems:   Abdominal pain     Richard Ritchey Orma Flaming, [facility]  If 7PM-7AM, please contact night-coverage [email or web address]   LOS: 1 day

## 2022-05-10 ENCOUNTER — Encounter (HOSPITAL_COMMUNITY)
Admission: EM | Disposition: A | Discharge status: Home / Self Care | Payer: Self-pay | Source: Home / Self Care | Attending: Internal Medicine

## 2022-05-10 ENCOUNTER — Other Ambulatory Visit: Payer: Self-pay

## 2022-05-10 ENCOUNTER — Inpatient Hospital Stay (HOSPITAL_COMMUNITY): Payer: BC Managed Care – PPO | Admitting: Certified Registered Nurse Anesthetist

## 2022-05-10 ENCOUNTER — Inpatient Hospital Stay (HOSPITAL_COMMUNITY): Payer: BC Managed Care – PPO

## 2022-05-10 HISTORY — PX: CHOLECYSTECTOMY: SHX55

## 2022-05-10 LAB — COMPREHENSIVE METABOLIC PANEL
ALT: 18 U/L (ref 0–44)
AST: 20 U/L (ref 15–41)
Albumin: 3.5 g/dL (ref 3.5–5.0)
Alkaline Phosphatase: 35 U/L — ABNORMAL LOW (ref 38–126)
Anion gap: 6 (ref 5–15)
BUN: 6 mg/dL (ref 6–20)
CO2: 24 mmol/L (ref 22–32)
Calcium: 8.7 mg/dL — ABNORMAL LOW (ref 8.9–10.3)
Chloride: 110 mmol/L (ref 98–111)
Creatinine, Ser: 0.97 mg/dL (ref 0.44–1.00)
GFR, Estimated: >60 mL/min (ref >60)
Glucose, Bld: 89 mg/dL (ref 70–99)
Potassium: 3.4 mmol/L — ABNORMAL LOW (ref 3.5–5.1)
Sodium: 140 mmol/L (ref 135–145)
Total Bilirubin: 0.4 mg/dL (ref 0.3–1.2)
Total Protein: 6 g/dL — ABNORMAL LOW (ref 6.5–8.1)

## 2022-05-10 LAB — CBC
HCT: 38.8 % (ref 36.0–46.0)
Hemoglobin: 13.3 g/dL (ref 12.0–15.0)
MCH: 31.3 pg (ref 26.0–34.0)
MCHC: 34.3 g/dL (ref 30.0–36.0)
MCV: 91.3 fL (ref 80.0–100.0)
Platelets: 160 10*3/uL (ref 150–400)
RBC: 4.25 MIL/uL (ref 3.87–5.11)
RDW: 12 % (ref 11.5–15.5)
WBC: 6.6 10*3/uL (ref 4.0–10.5)
nRBC: 0 % (ref 0.0–0.2)

## 2022-05-10 SURGERY — LAPAROSCOPIC CHOLECYSTECTOMY WITH INTRAOPERATIVE CHOLANGIOGRAM
Anesthesia: General | Site: Abdomen

## 2022-05-10 MED ORDER — PROCHLORPERAZINE EDISYLATE 10 MG/2ML IJ SOLN: 5.0000 mg | INTRAMUSCULAR | Status: DC | PRN

## 2022-05-10 MED ORDER — BUPIVACAINE LIPOSOME 1.3 % IJ SUSP
INTRAMUSCULAR | Status: AC
  Filled 2022-05-10: qty 20

## 2022-05-10 MED ORDER — ONDANSETRON HCL 4 MG/2ML IJ SOLN: 4.0000 mg | Freq: Four times a day (QID) | INTRAMUSCULAR | Status: DC | PRN

## 2022-05-10 MED ORDER — LIP MEDEX EX OINT
TOPICAL_OINTMENT | Freq: Two times a day (BID) | CUTANEOUS | Status: DC
  Filled 2022-05-10: qty 7

## 2022-05-10 MED ORDER — PROPOFOL 10 MG/ML IV BOLUS
INTRAVENOUS | Status: AC
  Filled 2022-05-10: qty 20

## 2022-05-10 MED ORDER — BUPIVACAINE-EPINEPHRINE (PF) 0.5% -1:200000 IJ SOLN
INTRAMUSCULAR | Status: AC
  Filled 2022-05-10: qty 30

## 2022-05-10 MED ORDER — LACTATED RINGERS IV SOLN: INTRAVENOUS | Status: DC | PRN

## 2022-05-10 MED ORDER — LACTATED RINGERS IR SOLN
Status: DC | PRN
  Administered 2022-05-10: 1000 mL

## 2022-05-10 MED ORDER — FENTANYL CITRATE (PF) 250 MCG/5ML IJ SOLN
INTRAMUSCULAR | Status: AC
  Filled 2022-05-10: qty 5

## 2022-05-10 MED ORDER — OXYCODONE HCL 5 MG/5ML PO SOLN: 5.0000 mg | Freq: Once | ORAL | Status: DC | PRN

## 2022-05-10 MED ORDER — PROMETHAZINE HCL 12.5 MG PO TABS: 12.5000 mg | ORAL_TABLET | Freq: Four times a day (QID) | ORAL | 1 refills | Status: DC | PRN

## 2022-05-10 MED ORDER — SODIUM CHLORIDE 0.9 % IV SOLN: 250.0000 mL | INTRAVENOUS | Status: DC | PRN

## 2022-05-10 MED ORDER — HYDROMORPHONE HCL 1 MG/ML IJ SOLN
0.5000 mg | INTRAMUSCULAR | Status: DC | PRN
  Administered 2022-05-10: 1 mg via INTRAVENOUS
  Filled 2022-05-10: qty 1

## 2022-05-10 MED ORDER — SCOPOLAMINE 1 MG/3DAYS TD PT72
MEDICATED_PATCH | TRANSDERMAL | Status: DC | PRN
  Administered 2022-05-10: 1 via TRANSDERMAL

## 2022-05-10 MED ORDER — OXYCODONE HCL 5 MG PO TABS: 5.0000 mg | ORAL_TABLET | Freq: Once | ORAL | Status: DC | PRN

## 2022-05-10 MED ORDER — LACTATED RINGERS IV BOLUS: 1000.0000 mL | Freq: Three times a day (TID) | INTRAVENOUS | Status: DC | PRN

## 2022-05-10 MED ORDER — PHENOL 1.4 % MT LIQD: 2.0000 | OROMUCOSAL | Status: DC | PRN

## 2022-05-10 MED ORDER — KETOROLAC TROMETHAMINE 30 MG/ML IJ SOLN
INTRAMUSCULAR | Status: DC | PRN
  Administered 2022-05-10: 30 mg via INTRAVENOUS

## 2022-05-10 MED ORDER — DEXAMETHASONE SODIUM PHOSPHATE 10 MG/ML IJ SOLN
INTRAMUSCULAR | Status: DC | PRN
  Administered 2022-05-10: 8 mg via INTRAVENOUS

## 2022-05-10 MED ORDER — SODIUM CHLORIDE 0.9 % IV SOLN: 8.0000 mg | Freq: Four times a day (QID) | INTRAVENOUS | Status: DC | PRN

## 2022-05-10 MED ORDER — MIDAZOLAM HCL 2 MG/2ML IJ SOLN
INTRAMUSCULAR | Status: AC
  Filled 2022-05-10: qty 2

## 2022-05-10 MED ORDER — SODIUM CHLORIDE 0.9% FLUSH: 3.0000 mL | Freq: Two times a day (BID) | INTRAVENOUS | Status: DC

## 2022-05-10 MED ORDER — ROCURONIUM BROMIDE 10 MG/ML (PF) SYRINGE
PREFILLED_SYRINGE | INTRAVENOUS | Status: DC | PRN
  Administered 2022-05-10: 60 mg via INTRAVENOUS

## 2022-05-10 MED ORDER — DIPHENHYDRAMINE HCL 50 MG/ML IJ SOLN: 12.5000 mg | Freq: Four times a day (QID) | INTRAMUSCULAR | Status: DC | PRN

## 2022-05-10 MED ORDER — MENTHOL 3 MG MT LOZG: 1.0000 | LOZENGE | OROMUCOSAL | Status: DC | PRN

## 2022-05-10 MED ORDER — BUPIVACAINE-EPINEPHRINE (PF) 0.5% -1:200000 IJ SOLN
INTRAMUSCULAR | Status: DC | PRN
  Administered 2022-05-10: 30 mL

## 2022-05-10 MED ORDER — ONDANSETRON HCL 4 MG/2ML IJ SOLN
4.0000 mg | Freq: Four times a day (QID) | INTRAMUSCULAR | Status: DC | PRN
  Administered 2022-05-10: 4 mg via INTRAVENOUS
  Filled 2022-05-10: qty 2

## 2022-05-10 MED ORDER — MAGIC MOUTHWASH: 15.0000 mL | Freq: Four times a day (QID) | ORAL | Status: DC | PRN

## 2022-05-10 MED ORDER — DEXTROSE 5 % IV SOLN
INTRAVENOUS | Status: DC | PRN
  Administered 2022-05-10: 2 g via INTRAVENOUS

## 2022-05-10 MED ORDER — BUPIVACAINE LIPOSOME 1.3 % IJ SUSP
INTRAMUSCULAR | Status: DC | PRN
  Administered 2022-05-10: 20 mL

## 2022-05-10 MED ORDER — SUGAMMADEX SODIUM 200 MG/2ML IV SOLN
INTRAVENOUS | Status: DC | PRN
  Administered 2022-05-10: 200 mg via INTRAVENOUS

## 2022-05-10 MED ORDER — TRAMADOL HCL 50 MG PO TABS
50.0000 mg | ORAL_TABLET | Freq: Four times a day (QID) | ORAL | Status: DC | PRN
  Administered 2022-05-10: 50 mg via ORAL
  Filled 2022-05-10: qty 1

## 2022-05-10 MED ORDER — LIDOCAINE HCL (CARDIAC) PF 100 MG/5ML IV SOSY
PREFILLED_SYRINGE | INTRAVENOUS | Status: DC | PRN
  Administered 2022-05-10: 60 mg via INTRAVENOUS

## 2022-05-10 MED ORDER — FENTANYL CITRATE (PF) 250 MCG/5ML IJ SOLN
INTRAMUSCULAR | Status: DC | PRN
Start: 2022-05-10 — End: 2022-05-10
  Administered 2022-05-10: 50 ug via INTRAVENOUS
  Administered 2022-05-10 (×2): 100 ug via INTRAVENOUS
  Administered 2022-05-10 (×2): 50 ug via INTRAVENOUS

## 2022-05-10 MED ORDER — ROCURONIUM BROMIDE 10 MG/ML (PF) SYRINGE
PREFILLED_SYRINGE | INTRAVENOUS | Status: AC
Start: 2022-05-10 — End: ?
  Filled 2022-05-10: qty 10

## 2022-05-10 MED ORDER — CALCIUM POLYCARBOPHIL 625 MG PO TABS
625.0000 mg | ORAL_TABLET | Freq: Two times a day (BID) | ORAL | Status: DC
  Administered 2022-05-10: 625 mg via ORAL
  Filled 2022-05-10: qty 1

## 2022-05-10 MED ORDER — PROPOFOL 10 MG/ML IV BOLUS
INTRAVENOUS | Status: DC | PRN
  Administered 2022-05-10: 30 mg via INTRAVENOUS
  Administered 2022-05-10: 170 mg via INTRAVENOUS

## 2022-05-10 MED ORDER — SODIUM CHLORIDE 0.9 % IR SOLN
Status: DC | PRN
  Administered 2022-05-10: 1000 mL

## 2022-05-10 MED ORDER — SODIUM CHLORIDE 0.9% FLUSH: 3.0000 mL | INTRAVENOUS | Status: DC | PRN

## 2022-05-10 MED ORDER — KETOROLAC TROMETHAMINE 30 MG/ML IJ SOLN
INTRAMUSCULAR | Status: AC
  Filled 2022-05-10: qty 1

## 2022-05-10 MED ORDER — SUCCINYLCHOLINE CHLORIDE 200 MG/10ML IV SOSY
PREFILLED_SYRINGE | INTRAVENOUS | Status: AC
  Filled 2022-05-10: qty 10

## 2022-05-10 MED ORDER — IOPAMIDOL (ISOVUE-300) INJECTION 61%
INTRAVENOUS | Status: DC | PRN
  Administered 2022-05-10: 6 mL

## 2022-05-10 MED ORDER — MIDAZOLAM HCL 2 MG/2ML IJ SOLN
INTRAMUSCULAR | Status: DC | PRN
  Administered 2022-05-10: 2 mg via INTRAVENOUS

## 2022-05-10 MED ORDER — FENTANYL CITRATE (PF) 100 MCG/2ML IJ SOLN
INTRAMUSCULAR | Status: AC
  Filled 2022-05-10: qty 2

## 2022-05-10 MED ORDER — ACETAMINOPHEN 500 MG PO TABS
1000.0000 mg | ORAL_TABLET | Freq: Four times a day (QID) | ORAL | Status: DC
  Administered 2022-05-10: 1000 mg via ORAL
  Filled 2022-05-10: qty 2

## 2022-05-10 MED ORDER — ONDANSETRON HCL 4 MG/2ML IJ SOLN
INTRAMUSCULAR | Status: DC | PRN
  Administered 2022-05-10: 4 mg via INTRAVENOUS

## 2022-05-10 MED ORDER — ALUM & MAG HYDROXIDE-SIMETH 200-200-20 MG/5ML PO SUSP: 30.0000 mL | Freq: Four times a day (QID) | ORAL | Status: DC | PRN

## 2022-05-10 MED ORDER — SODIUM CHLORIDE 0.9 % IV SOLN
INTRAVENOUS | Status: AC
  Filled 2022-05-10: qty 20

## 2022-05-10 MED ORDER — SCOPOLAMINE 1 MG/3DAYS TD PT72
MEDICATED_PATCH | TRANSDERMAL | Status: AC
  Filled 2022-05-10: qty 1

## 2022-05-10 MED ORDER — LIDOCAINE HCL (PF) 2 % IJ SOLN
INTRAMUSCULAR | Status: AC
  Filled 2022-05-10: qty 5

## 2022-05-10 MED ORDER — STERILE WATER FOR IRRIGATION IR SOLN
Status: DC | PRN
  Administered 2022-05-10: 1000 mL

## 2022-05-10 MED ORDER — TRAMADOL HCL 50 MG PO TABS: 50.0000 mg | ORAL_TABLET | Freq: Four times a day (QID) | ORAL | 0 refills | Status: DC | PRN

## 2022-05-10 MED ORDER — DEXAMETHASONE SODIUM PHOSPHATE 10 MG/ML IJ SOLN
INTRAMUSCULAR | Status: AC
Start: 2022-05-10 — End: ?
  Filled 2022-05-10: qty 1

## 2022-05-10 MED ORDER — ONDANSETRON HCL 4 MG/2ML IJ SOLN
INTRAMUSCULAR | Status: AC
  Filled 2022-05-10: qty 2

## 2022-05-10 MED ORDER — FENTANYL CITRATE PF 50 MCG/ML IJ SOSY: 25.0000 ug | PREFILLED_SYRINGE | INTRAMUSCULAR | Status: DC | PRN

## 2022-05-10 SURGICAL SUPPLY — 43 items
APPLIER CLIP 5 13 M/L LIGAMAX5 (MISCELLANEOUS)
APPLIER CLIP ROT 10 11.4 M/L (STAPLE)
BAG COUNTER SPONGE SURGICOUNT (BAG) ×1 IMPLANT
CABLE HIGH FREQUENCY MONO STRZ (ELECTRODE) IMPLANT
CLIP APPLIE 5 13 M/L LIGAMAX5 (MISCELLANEOUS) IMPLANT
CLIP APPLIE ROT 10 11.4 M/L (STAPLE) IMPLANT
COVER MAYO STAND XLG (MISCELLANEOUS) ×1 IMPLANT
COVER SURGICAL LIGHT HANDLE (MISCELLANEOUS) ×1 IMPLANT
DRAPE 3/4 80X56 (DRAPES) ×1 IMPLANT
DRAPE C-ARM 42X120 X-RAY (DRAPES) ×1 IMPLANT
DRAPE UTILITY XL STRL (DRAPES) ×1 IMPLANT
DRAPE WARM FLUID 44X44 (DRAPES) ×1 IMPLANT
DRSG TEGADERM 2-3/8X2-3/4 SM (GAUZE/BANDAGES/DRESSINGS) ×2 IMPLANT
DRSG TEGADERM 6X8 (GAUZE/BANDAGES/DRESSINGS) ×1 IMPLANT
ELECT REM PT RETURN 15FT ADLT (MISCELLANEOUS) ×1 IMPLANT
ENDOLOOP SUT PDS II  0 18 (SUTURE)
ENDOLOOP SUT PDS II 0 18 (SUTURE) IMPLANT
GLOVE ECLIPSE 8.0 STRL XLNG CF (GLOVE) ×1 IMPLANT
GLOVE INDICATOR 8.0 STRL GRN (GLOVE) ×1 IMPLANT
GOWN STRL REUS W/ TWL XL LVL3 (GOWN DISPOSABLE) ×3 IMPLANT
GOWN STRL REUS W/TWL XL LVL3 (GOWN DISPOSABLE) ×3
IRRIG SUCT STRYKERFLOW 2 WTIP (MISCELLANEOUS) ×1
IRRIGATION SUCT STRKRFLW 2 WTP (MISCELLANEOUS) ×1 IMPLANT
KIT BASIN OR (CUSTOM PROCEDURE TRAY) ×1 IMPLANT
KIT TURNOVER KIT A (KITS) IMPLANT
PENCIL SMOKE EVACUATOR (MISCELLANEOUS) IMPLANT
POUCH RETRIEVAL ECOSAC 10 (ENDOMECHANICALS) ×1 IMPLANT
POUCH RETRIEVAL ECOSAC 10MM (ENDOMECHANICALS) ×1
SCISSORS LAP 5X35 DISP (ENDOMECHANICALS) ×1 IMPLANT
SET CHOLANGIOGRAPH MIX (MISCELLANEOUS) ×1 IMPLANT
SET TUBE SMOKE EVAC HIGH FLOW (TUBING) ×1 IMPLANT
SLEEVE Z-THREAD 5X100MM (TROCAR) IMPLANT
SPIKE FLUID TRANSFER (MISCELLANEOUS) ×1 IMPLANT
SPONGE GAUZE 2X2 8PLY STRL LF (GAUZE/BANDAGES/DRESSINGS) IMPLANT
SUT MNCRL AB 4-0 PS2 18 (SUTURE) ×1 IMPLANT
SUT PDS AB 1 CT  36 (SUTURE)
SUT PDS AB 1 CT 36 (SUTURE) IMPLANT
SYR 20ML LL LF (SYRINGE) ×1 IMPLANT
TOWEL OR 17X26 10 PK STRL BLUE (TOWEL DISPOSABLE) ×1 IMPLANT
TOWEL OR NON WOVEN STRL DISP B (DISPOSABLE) ×1 IMPLANT
TRAY LAPAROSCOPIC (CUSTOM PROCEDURE TRAY) ×1 IMPLANT
TROCAR 11X100 Z THREAD (TROCAR) ×1 IMPLANT
TROCAR Z-THREAD OPTICAL 5X100M (TROCAR) ×1 IMPLANT

## 2022-05-10 NOTE — Op Note (Signed)
05/10/2022  PATIENT:  Morgan Marshall  21 y.o. female  Patient Care Team: Allwardt, Crist Infante, PA-C as PCP - General (Physician Assistant) Physicians, [facility] Charna Elizabeth, MD as Consulting Physician (Gastroenterology) Karie Soda, MD as Consulting Physician (General Surgery)  PRE-OPERATIVE DIAGNOSIS:    Chronic Cholecystitis  POST-OPERATIVE DIAGNOSIS:   Chronic Cholecystitis  PROCEDURE:  SINGLE SITE Laparoscopic cholecystectomy with intraoperative cholangiogram (CPT code 89381)  SURGEON:  Ardeth Sportsman, MD, FACS.  ASSISTANT: OR Staff   ANESTHESIA:    General with endotracheal intubation Local anesthetic as a field block  EBL:  (See Anesthesia Intraoperative Record) Total I/O In: 1200 [I.V.:1200] Out: 20 [Blood:20]  Delay start of Pharmacological VTE agent (>24hrs) due to surgical blood loss or risk of bleeding:  no  DRAINS: None   SPECIMEN: Gallbladder    DISPOSITION OF SPECIMEN:  PATHOLOGY  COUNTS:  YES  PLAN OF CARE: Admit to inpatient   PATIENT DISPOSITION:  PACU - hemodynamically stable.  INDICATION: Young woman with nausea vomiting upper abdominal pain.  Foregut work-up negative for major heartburn Phasix ulcer.  No gallstones but low gallbladder ejection fraction with reproduction of symptoms with HIDA scan nuclear medicine study.  That will be keep anything down.  Cholecystectomy offered.  The anatomy & physiology of hepatobiliary & pancreatic function was discussed.  The pathophysiology of gallbladder dysfunction was discussed.  Natural history risks without surgery was discussed.   I feel the risks of no intervention will lead to serious problems that outweigh the operative risks; therefore, I recommended cholecystectomy to remove the pathology.  I explained laparoscopic techniques with possible need for an open approach.  Probable cholangiogram to evaluate the bilary tract was explained as well.    Risks such as bleeding, infection,  abscess, leak, injury to other organs, need for further treatment, heart attack, death, and other risks were discussed.  I noted a good likelihood this will help address the problem.  Possibility that this will not correct all abdominal symptoms was explained.  Goals of post-operative recovery were discussed as well.  We will work to minimize complications.  An educational handout further explaining the pathology and treatment options was given as well.  Questions were answered.  The patient expresses understanding & wishes to proceed with surgery.  OR FINDINGS: Dilated boggy gallbladder with some adhesions consistent with chronic cholecystitis.  Cholangiogram with very narrowed biliary system.  Stricturing at the infundibulum consistent with partial cystic duct obstruction.  Liver: normal  DESCRIPTION:   The patient was identified & brought in the operating room. The patient was positioned supine with arms tucked. SCDs were active during the entire case. The patient underwent general anesthesia without any difficulty.  The abdomen was prepped and draped in a sterile fashion. A Surgical Timeout confirmed our plan.  I made a transverse curvilinear incision through the inferior umbilical fold, avoiding her supraumbilical piercing.  I placed a 14mm long port through the supraumbilical fascia using a modified Hassan cutdown technique with umbilical stalk fascial countertraction. I began carbon dioxide insufflation.  No change in end tidal CO2 measurement.   Camera inspection revealed no injury. There were no adhesions to the anterior abdominal wall supraumbilically.  I proceeded to continue with single site technique. I placed a #5 port in left upper aspect of the wound. I placed a 5 mm atraumatic grasper in the right inferior aspect of the wound.  I turned attention to the right upper quadrant.  Operative findings as noted above.  The  gallbladder fundus was elevated cephalad. I freed adhesions to the  ventral surface of the gallbladder off carefully.  I freed the peritoneal coverings between the gallbladder and the liver on the posteriolateral and anteriomedial walls. I alternated between Harmonic & blunt Maryland dissection to help get a good critical view of the cystic artery and cystic duct.  did further dissection to free 50%of the gallbladder off the liver bed to get a good critical view of the infundibulum and cystic duct. I dissected out the cystic artery; and, after getting a good 360 view, ligated the anterior & posterior branches of the cystic artery close on the infundibulum using the Harmonic ultrasonic dissection.  I skeletonized the cystic duct.  I placed a clip on the infundibulum. I did a partial cystic duct-otomy.  Try to cannulate but is fibrotic.  Concerning for probable cystic duct obstruction due to scarring and narrow biliary system.  Did another ductotomy 5 mm more proximally and ensured patency. I placed a 5 [place] cholangiocatheter through a puncture site at the right subcostal ridge of the abdominal wall and directed it into the cystic duct.  We ran a cholangiogram with dilute radio-opaque contrast and continuous fluoroscopy. Contrast flowed from a side branch consistent with cystic duct cannulization. Contrast flowed up the common hepatic duct into the right and left intrahepatic chains out to secondary radicals. Contrast flowed down the common bile duct easily across the normal ampulla into the duodenum.  The biliary system overall was rather narrowed but intact.    I removed the cholangiocatheter. I placed clips on the cystic duct x4.   I completed cystic duct transection. I freed the gallbladder from its remaining attachments to the liver. I ensured hemostasis on the gallbladder fossa of the liver and elsewhere. I inspected the rest of the abdomen & detected no injury nor bleeding elsewhere.  I placed the gallbladder inside and EcoSac and removed the gallbladder out the  supraumbilical fascia. I closed the fascia transversely using  #1 PDS interrupted stitches. I closed the skin using 4-0 monocryl stitch.  Sterile dressing was applied. The patient was extubated & arrived in the PACU in stable condition..  I had discussed postoperative care with the patient in the holding area. I discussed operative findings, updated the patient's status, discussed probable steps to recovery, and gave postoperative recommendations to the  patient's mother, Morgan Marshall .  Recommendations were made.  Questions were answered.  She expressed understanding & appreciation.  Ardeth Sportsman, M.D., F.A.C.S. Gastrointestinal and Minimally Invasive Surgery [facility] [address] [place], [place] [address] [phone] Main / Paging  05/10/2022 8:42 AM

## 2022-05-10 NOTE — Anesthesia Procedure Notes (Signed)
Procedure Name: Intubation Date/Time: 05/10/2022 7:36 AM  Performed by: Raenette Rover, CRNAPre-anesthesia Checklist: Patient identified, Emergency Drugs available, Suction available and Patient being monitored Patient Re-evaluated:Patient Re-evaluated prior to induction Oxygen Delivery Method: Circle system utilized Preoxygenation: Pre-oxygenation with 100% oxygen Induction Type: IV induction Ventilation: Mask ventilation without difficulty Laryngoscope Size: Mac and 3 Grade View: Grade I Tube type: Oral Tube size: 7.0 mm Number of attempts: 1 Airway Equipment and Method: Stylet Placement Confirmation: ETT inserted through vocal cords under direct vision, positive ETCO2 and breath sounds checked- equal and bilateral Secured at: 21 cm Tube secured with: Tape Dental Injury: Teeth and Oropharynx as per pre-operative assessment

## 2022-05-10 NOTE — Anesthesia Preprocedure Evaluation (Signed)
Anesthesia Evaluation  Patient identified by MRN, date of birth, ID band Patient awake    Reviewed: Allergy & Precautions, H&P , NPO status , Patient's Chart, lab work & pertinent test results  Airway Mallampati: I   Neck ROM: full    Dental   Pulmonary asthma ,    breath sounds clear to auscultation       Cardiovascular negative cardio ROS   Rhythm:regular Rate:Normal     Neuro/Psych PSYCHIATRIC DISORDERS Anxiety    GI/Hepatic GERD  ,  Endo/Other  obese  Renal/GU      Musculoskeletal   Abdominal   Peds  Hematology   Anesthesia Other Findings   Reproductive/Obstetrics                             Anesthesia Physical Anesthesia Plan  ASA: 2  Anesthesia Plan: General   Post-op Pain Management:    Induction: Intravenous  PONV Risk Score and Plan: 3 and Ondansetron, Dexamethasone, Midazolam and Treatment may vary due to age or medical condition  Airway Management Planned: Oral ETT  Additional Equipment:   Intra-op Plan:   Post-operative Plan: Extubation in OR  Informed Consent: I have reviewed the patients History and Physical, chart, labs and discussed the procedure including the risks, benefits and alternatives for the proposed anesthesia with the patient or authorized representative who has indicated his/her understanding and acceptance.     Dental advisory given  Plan Discussed with: CRNA, Anesthesiologist and Surgeon  Anesthesia Plan Comments:         Anesthesia Quick Evaluation

## 2022-05-10 NOTE — Interval H&P Note (Signed)
History and Physical Interval Note:  05/10/2022 7:06 AM  Morgan Marshall  has presented today for surgery, with the diagnosis of BILIARY DYSKENISIA.  The various methods of treatment have been discussed with the patient and family. After consideration of risks, benefits and other options for treatment, the patient has consented to  Procedure(s): LAPAROSCOPIC CHOLECYSTECTOMY WITH INTRAOPERATIVE CHOLANGIOGRAM (N/A) as a surgical intervention.  The patient's history has been reviewed, patient examined, no change in status, stable for surgery.  I have reviewed the patient's chart and labs.  Questions were answered to the patient's satisfaction.    I have re-reviewed the the patient's records, history, medications, and allergies.  I have re-examined the patient.  I again discussed intraoperative plans and goals of post-operative recovery.  The patient agrees to proceed.  Morgan Marshall  12/11/2000 [id number]  Patient Care Team: Allwardt, Crist Infante, PA-C as PCP - General (Physician Assistant) Physicians, [facility]  Patient Active Problem List   Diagnosis Date Noted   Abdominal pain 05/08/2022   Epigastric pain 05/06/2022   Asthma exacerbation, mild 07/18/2021   Acute mucoid otitis media of left ear 07/18/2021   GAD (generalized anxiety disorder) 12/31/2020   Nail avulsion, finger, initial encounter 08/24/2019   Irregular periods 02/17/2018   Asthma 11/17/2017   Childhood obesity 11/17/2017    Past Medical History:  Diagnosis Date   Asthma    Chicken pox    GERD (gastroesophageal reflux disease)    Jaundice    Urinary tract infection     Past Surgical History:  Procedure Laterality Date   TYMPANOSTOMY TUBE PLACEMENT      Social History   Socioeconomic History   Marital status: Single    Spouse name: Not on file   Number of children: Not on file   Years of education: Not on file   Highest education level: Not on file  Occupational History   Not on file  Tobacco Use    Smoking status: Never   Smokeless tobacco: Never  Vaping Use   Vaping Use: Never used  Substance and Sexual Activity   Alcohol use: Not Currently   Drug use: Not Currently   Sexual activity: Not on file  Other Topics Concern   Not on file  Social History Narrative   Not on file   Social Determinants of Health   Financial Resource Strain: Not on file  Food Insecurity: Not on file  Transportation Needs: Not on file  Physical Activity: Not on file  Stress: Not on file  Social Connections: Not on file  Intimate Partner Violence: Not on file    Family History  Problem Relation Age of Onset   Heart attack Mother    Hyperlipidemia Father    Asthma Brother    Arthritis Maternal Grandmother    Asthma Maternal Grandmother    Hyperlipidemia Maternal Grandmother    Hyperlipidemia Maternal Grandfather    Hyperlipidemia Paternal Grandmother    Early death Paternal Grandfather     Medications Prior to Admission  Medication Sig Dispense Refill Last Dose   albuterol (VENTOLIN HFA) 108 (90 Base) MCG/ACT inhaler INHALE 1-2 PUFFS INTO THE LUNGS EVERY 6 HOURS AS NEEDED. (Patient taking differently: Inhale 1-2 puffs into the lungs every 6 (six) hours as needed for wheezing or shortness of breath.) 18 each 0 more than a  month   busPIRone (BUSPAR) 7.5 MG tablet Take one tab up to three times daily as needed for anxiety. (Patient taking differently: Take 7.5 mg by mouth  daily as needed (for anxiety).) 90 tablet 1 05/04/2022   JUNEL FE 1.5/30 1.5-30 MG-MCG tablet TAKE 1 TABLET BY MOUTH EVERY DAY 84 tablet 3 05/06/2022   ondansetron (ZOFRAN-ODT) 4 MG disintegrating tablet Take 4 mg by mouth every 8 (eight) hours as needed for nausea.   05/06/2022 at am   pantoprazole (PROTONIX) 20 MG tablet Take 20 mg by mouth daily.   05/06/2022 at 1330   Probiotic Product (PROBIOTIC DAILY PO) Take by mouth.   05/04/2022   sucralfate (CARAFATE) 1 GM/10ML suspension Take 10 mLs by mouth in the morning, at noon, in  the evening, and at bedtime.   05/06/2022 at am    Current Facility-Administered Medications  Medication Dose Route Frequency Provider Last Rate Last Admin   [MAR Hold] acetaminophen (TYLENOL) tablet 650 mg  650 mg Oral Q6H PRN Charlsie Quest, MD   650 mg at 05/08/22 2242   Or   [MAR Hold] acetaminophen (TYLENOL) suppository 650 mg  650 mg Rectal Q6H PRN Charlsie Quest, MD       [MAR Hold] enoxaparin (LOVENOX) injection 40 mg  40 mg Subcutaneous Q24H Darreld Mclean R, MD   40 mg at 05/09/22 2020   [MAR Hold] famotidine (PEPCID) IVPB 20 mg premix  20 mg Intravenous Q12H Leandro Reasoner Tublu, MD 100 mL/hr at 05/09/22 2020 20 mg at 05/09/22 2020   [MAR Hold] LORazepam (ATIVAN) tablet 0.5 mg  0.5 mg Oral Q6H PRN Opyd, Lavone Neri, MD       [MAR Hold] ondansetron (ZOFRAN-ODT) disintegrating tablet 4 mg  4 mg Oral Q8H PRN Dorcas Carrow, MD   4 mg at 05/09/22 2026   sodium chloride 0.9 % with cefTRIAXone (ROCEPHIN) ADS Med              Allergies  Allergen Reactions   Latex Hives    BP 110/74 (BP Location: Right Arm)   Pulse 61   Temp 98.4 F (36.9 C)   Resp 16   Ht 5\' 8"  (1.727 m)   Wt 104.3 kg   LMP 04/07/2022   SpO2 100%   BMI 34.97 kg/m   Labs: Results for orders placed or performed during the hospital encounter of 05/06/22 (from the past 48 hour(s))  Comprehensive metabolic panel     Status: Abnormal   Collection Time: 05/10/22  5:31 AM  Result Value Ref Range   Sodium 140 135 - 145 mmol/L   Potassium 3.4 (L) 3.5 - 5.1 mmol/L   Chloride 110 98 - 111 mmol/L   CO2 24 22 - 32 mmol/L   Glucose, Bld 89 70 - 99 mg/dL    Comment: Glucose reference range applies only to samples taken after fasting for at least 8 hours.   BUN 6 6 - 20 mg/dL   Creatinine, Ser 05/12/22 0.44 - 1.00 mg/dL   Calcium 8.7 (L) 8.9 - 10.3 mg/dL   Total Protein 6.0 (L) 6.5 - 8.1 g/dL   Albumin 3.5 3.5 - 5.0 g/dL   AST 20 15 - 41 U/L   ALT 18 0 - 44 U/L   Alkaline Phosphatase 35 (L) 38 - 126 U/L   Total  Bilirubin 0.4 0.3 - 1.2 mg/dL   GFR, Estimated 0.16 >01 mL/min    Comment: (NOTE) Calculated using the CKD-EPI Creatinine Equation (2021)    Anion gap 6 5 - 15    Comment: Performed at [facility], [address], [place], [place]  CBC  Status: None   Collection Time: 05/10/22  5:31 AM  Result Value Ref Range   WBC 6.6 4.0 - 10.5 K/uL   RBC 4.25 3.87 - 5.11 MIL/uL   Hemoglobin 13.3 12.0 - 15.0 g/dL   HCT 38.8 36.0 - 46.0 %   MCV 91.3 80.0 - 100.0 fL   MCH 31.3 26.0 - 34.0 pg   MCHC 34.3 30.0 - 36.0 g/dL   RDW 12.0 11.5 - 15.5 %   Platelets 160 150 - 400 K/uL   nRBC 0.0 0.0 - 0.2 %    Comment: Performed at [facility], [place] [address] [place], [place] [address]    Imaging / Studies: NM Hepato W/EF  Result Date: 05/08/2022 CLINICAL DATA:  CHRONIC UPPER ABDOMINAL PAIN X WEEK NAUSEA X 5 DAYS EXAM: NUCLEAR MEDICINE HEPATOBILIARY IMAGING WITH GALLBLADDER EF TECHNIQUE: Sequential images of the abdomen were obtained out to 60 minutes following intravenous administration of radiopharmaceutical. After oral ingestion of Ensure, gallbladder ejection fraction was determined. At 60 min, normal ejection fraction is greater than 33%. RADIOPHARMACEUTICALS:  5.5 mCi Tc-21m  Choletec IV COMPARISON:  [place]  05/06/2022. FINDINGS: Prompt uptake and biliary excretion of activity by the liver is seen. Gallbladder activity is visualized, consistent with patency of cystic duct. Biliary activity passes into small bowel, consistent with patent common bile duct. Calculated gallbladder ejection fraction is 8%. (Normal gallbladder ejection fraction with Ensure is greater than 33%.) IMPRESSION: Reduced gallbladder ejection fraction as can be seen with chronic cholecystitis/biliary dyskinesia. Electronically Signed   By: Dahlia Bailiff M.D.   On: 05/08/2022 13:41   DG Chest Portable 1 View  Result Date: 05/06/2022 CLINICAL DATA:  Upper abdominal pain EXAM: PORTABLE CHEST  1 VIEW COMPARISON:  None Available. FINDINGS: The heart size and mediastinal contours are within normal limits. Both lungs are clear. The visualized skeletal structures are unremarkable. No free intraperitoneal gas seen beneath the hemidiaphragms. IMPRESSION: No active disease. Electronically Signed   By: Fidela Salisbury M.D.   On: 05/06/2022 22:29   US Abdomen Limited RUQ (LIVER/GB)  Result Date: 05/06/2022 CLINICAL DATA:  Get epigastric pain EXAM: ULTRASOUND ABDOMEN LIMITED RIGHT UPPER QUADRANT COMPARISON:  None Available. FINDINGS: Gallbladder: No gallstones or wall thickening visualized. No sonographic Murphy sign noted by sonographer. Common bile duct: Diameter: Normal caliber, 2 mm Liver: No focal lesion identified. Within normal limits in parenchymal echogenicity. Portal vein is patent on color Doppler imaging with normal direction of blood flow towards the liver. Other: None. IMPRESSION: No acute findings. Electronically Signed   By: Rolm Baptise M.D.   On: 05/06/2022 19:17     .Adin Hector, M.D., F.A.C.S. Gastrointestinal and Minimally Invasive Surgery [facility] [address] [place], [place] [address] [phone] Main / Paging  05/10/2022 7:06 AM    Adin Hector

## 2022-05-10 NOTE — Progress Notes (Signed)
Pt went to OR for surgery. 

## 2022-05-10 NOTE — Transfer of Care (Signed)
Immediate Anesthesia Transfer of Care Note  Patient: Morgan Marshall  Procedure(s) Performed: LAPAROSCOPIC CHOLECYSTECTOMY WITH INTRAOPERATIVE CHOLANGIOGRAM (Abdomen)  Patient Location: PACU  Anesthesia Type:General  Level of Consciousness: drowsy and patient cooperative  Airway & Oxygen Therapy: Patient Spontanous Breathing and Patient connected to face mask oxygen  Post-op Assessment: Report given to RN and Post -op Vital signs reviewed and stable  Post vital signs: Reviewed and stable  Last Vitals:  Vitals Value Taken Time  BP 122/70 05/10/22 0839  Temp    Pulse 92 05/10/22 0841  Resp 13 05/10/22 0841  SpO2 100 % 05/10/22 0841  Vitals shown include unvalidated device data.  Last Pain:  Vitals:   05/10/22 0506  TempSrc: Oral  PainSc: 0-No pain      Patients Stated Pain Goal: 3 (05/09/22 2016)  Complications: No notable events documented.

## 2022-05-10 NOTE — Discharge Instructions (Signed)
################################################################  LAPAROSCOPIC SURGERY: POST OP INSTRUCTIONS  ######################################################################  EAT Gradually transition to a high fiber diet with a fiber supplement over the next few weeks after discharge.  Start with a pureed / full liquid diet (see below)  WALK Walk an hour a day.  Control your pain to do that.    CONTROL PAIN Control pain so that you can walk, sleep, tolerate sneezing/coughing, go up/down stairs.  HAVE A BOWEL MOVEMENT DAILY Keep your bowels regular to avoid problems.  OK to try a laxative to override constipation.  OK to use an antidairrheal to slow down diarrhea.  Call if not better after 2 tries  CALL IF YOU HAVE PROBLEMS/CONCERNS Call if you are still struggling despite following these instructions. Call if you have concerns not answered by these instructions  ######################################################################    DIET: Follow a light bland diet & liquids the first 24 hours after arrival home, such as soup, liquids, starches, etc.  Be sure to drink plenty of fluids.  Quickly advance to a usual solid diet within a few days.  Avoid fast food or heavy meals as your are more likely to get nauseated or have irregular bowels.  A low-fat, high-fiber diet for the rest of your life is ideal.  Take your usually prescribed home medications unless otherwise directed.  PAIN CONTROL: Pain is best controlled by a usual combination of three different methods TOGETHER: Ice/Heat Over the counter pain medication Prescription pain medication Most patients will experience some swelling and bruising around the incisions.  Ice packs or heating pads (30-60 minutes up to 6 times a day) will help. Use ice for the first few days to help decrease swelling and bruising, then switch to heat to help relax tight/sore spots and speed recovery.  Some people prefer to use ice alone, heat  alone, alternating between ice & heat.  Experiment to what works for you.  Swelling and bruising can take several weeks to resolve.   It is helpful to take an over-the-counter pain medication regularly for the first few weeks.  Choose one of the following that works best for you: Naproxen (Aleve, etc)  Two 260m tabs twice a day Ibuprofen (Advil, etc) Three 2033mtabs four times a day (every meal & bedtime) Acetaminophen (Tylenol, etc) 500-65061mour times a day (every meal & bedtime) A  prescription for pain medication (such as oxycodone, hydrocodone, tramadol, gabapentin, methocarbamol, etc) should be given to you upon discharge.  Take your pain medication as prescribed.  If you are having problems/concerns with the prescription medicine (does not control pain, nausea, vomiting, rash, itching, etc), please call us [phone] see if we need to switch you to a different pain medicine that will work better for you and/or control your side effect better. If you need a refill on your pain medication, please give us [place] hour notice.  contact your pharmacy.  They will contact our office to request authorization. Prescriptions will not be filled after 5 pm or on week-ends  Avoid getting constipated.   Between the surgery and the pain medications, it is common to experience some constipation.   Increasing fluid intake and taking a fiber supplement (such as Metamucil, Citrucel, FiberCon, MiraLax, etc) 1-2 times a day regularly will usually help prevent this problem from occurring.   A mild laxative (prune juice, Milk of Magnesia, MiraLax, etc) should be taken according to package directions if there are no bowel movements after 48 hours.   Watch out for diarrhea.  If you have many loose bowel movements, simplify your diet to bland foods & liquids for a few days.   Stop any stool softeners and decrease your fiber supplement.   Switching to mild anti-diarrheal medications (Kayopectate, Pepto Bismol) can  help.   If this worsens or does not improve, please call us.  Wash / shower every day.  You may shower over the dressings as they are waterproof.  Continue to shower over incision(s) after the dressing is off.  It is good for closed incisions and even open wounds to be washed every day.  Shower every day.  Short baths are fine.  Wash the incisions and wounds clean with soap & water.    You may leave closed incisions open to air if it is dry.   You may cover the incision with clean gauze & replace it after your daily shower for comfort.  TEGADERM:  You have clear gauze band-aid dressings over your closed incision(s).  Remove the dressings 3 days after surgery.= 8/29    ACTIVITIES as tolerated:   You may resume regular (light) daily activities beginning the next day--such as daily self-care, walking, climbing stairs--gradually increasing activities as tolerated.  If you can walk 30 minutes without difficulty, it is safe to try more intense activity such as jogging, treadmill, bicycling, low-impact aerobics, swimming, etc. Save the most intensive and strenuous activity for last such as sit-ups, heavy lifting, contact sports, etc  Refrain from any heavy lifting or straining until you are off narcotics for pain control.   DO NOT PUSH THROUGH PAIN.  Let pain be your guide: If it hurts to do something, don't do it.  Pain is your body warning you to avoid that activity for another week until the pain goes down. You may drive when you are no longer taking prescription pain medication, you can comfortably wear a seatbelt, and you can safely maneuver your car and apply brakes. You may have sexual intercourse when it is comfortable.  FOLLOW UP in our office Please call [facility] at [phone] to set up an appointment to see your surgeon in the office for a follow-up appointment approximately 2-3 weeks after your surgery. Make sure that you call for this appointment the day you arrive home to insure a  convenient appointment time.  10. IF YOU HAVE DISABILITY OR FAMILY LEAVE FORMS, BRING THEM TO THE OFFICE FOR PROCESSING.  DO NOT GIVE THEM TO YOUR DOCTOR.   WHEN TO CALL us [phone]: Poor pain control Reactions / problems with new medications (rash/itching, nausea, etc)  Fever over 101.5 F (38.5 C) Inability to urinate Nausea and/or vomiting Worsening swelling or bruising Continued bleeding from incision. Increased pain, redness, or drainage from the incision   The clinic staff is available to answer your questions during regular business hours (8:30am-5pm).  Please don't hesitate to call and ask to speak to one of our nurses for clinical concerns.   If you have a medical emergency, go to the nearest emergency room or call 911.  A surgeon from [facility] is always on call at the [facility], [place] [address], [place], [place]  [address] ? MAIN: [phone] ? TOLL FREE: [phone] ?  FAX [phone] [email or web address]  ##############################################################

## 2022-05-10 NOTE — Progress Notes (Signed)
Nurse reviewed discharge instructions with pt and her mother via epic in room as nurse was unable to print AVS at time of discharge.  Pt and mother verbalized understanding of discharge instructions, follow up appointments and new medications.  Nurse informed pt to look for AVS on mychart.  Per Dr. Michaell Cowing, pt able to discharge as he was unable to get to computer for AVS reconcilation as he was in the OR at the time.

## 2022-05-11 ENCOUNTER — Encounter (HOSPITAL_COMMUNITY): Payer: Self-pay | Admitting: Gastroenterology

## 2022-05-11 LAB — NASOPHARYNGEAL CULTURE: Culture: NORMAL

## 2022-05-12 ENCOUNTER — Encounter (HOSPITAL_COMMUNITY): Payer: Self-pay | Admitting: Surgery

## 2022-05-12 NOTE — Anesthesia Postprocedure Evaluation (Signed)
Anesthesia Post Note  Patient: Morgan Marshall  Procedure(s) Performed: LAPAROSCOPIC CHOLECYSTECTOMY WITH INTRAOPERATIVE CHOLANGIOGRAM (Abdomen)     Patient location during evaluation: PACU Anesthesia Type: General Level of consciousness: awake and alert Pain management: pain level controlled Vital Signs Assessment: post-procedure vital signs reviewed and stable Respiratory status: spontaneous breathing, nonlabored ventilation, respiratory function stable and patient connected to nasal cannula oxygen Cardiovascular status: blood pressure returned to baseline and stable Postop Assessment: no apparent nausea or vomiting Anesthetic complications: no   No notable events documented.  Last Vitals:  Vitals:   05/10/22 1030 05/10/22 1130  BP: 133/84 (!) 145/89  Pulse: 89 64  Resp: 18 18  Temp: 36.7 C 36.4 C  SpO2:  100%    Last Pain:  Vitals:   05/10/22 1500  TempSrc:   PainSc: 3                  Floretta Petro S

## 2022-05-13 LAB — SURGICAL PATHOLOGY

## 2022-05-14 NOTE — Discharge Summary (Signed)
[facility] Discharge Summary   Patient ID: Morgan Marshall MRN: [id number] DOB/AGE: 03-Oct-2000 21 y.o.  Admit date: 05/06/2022 Discharge date: 05/10/2022  Admitting Diagnosis: Right upper quadrant abdominal pain [R10.11] Abdominal pain [R10.9]   Discharge Diagnosis Chronic cholecystitis s/p laparoscopic cholecystectomy with [facility]  Consultants Gastroenterology  [facility]  Imaging: No results found.  Procedures Dr. Michaell Cowing (05/10/22) - Laparoscopic Cholecystectomy with [facility]   Hospital Course:  21 year old who presented to [facility] ED with abdominal pain, nausea and emesis.  She was evaluated by GI and endoscopy was negative for source of her symptoms. HIDA scan performed whish showed biliary dyskinesia.  Patient underwent procedure listed above with findings of chronic cholecystitis.  Tolerated procedure well. General surgery assumed primary role. On date of procedure, the patient was recovering well, pain well controlled, vital signs stable, incisions c/d/i and felt stable for discharge home.  Patient will follow up in our office in 2-3 weeks and knows to call with questions or concerns.  She will call to confirm appointment date/time.    I or a member of my team have reviewed this patient in the Controlled Substance Database. I was not directly involved in this patient's care therefore the information in this discharge summary was taken from the chart.  Allergies as of 05/10/2022       Reactions   Latex Hives        Medication List     TAKE these medications    albuterol 108 (90 Base) MCG/ACT inhaler Commonly known as: VENTOLIN HFA INHALE 1-2 PUFFS INTO THE LUNGS EVERY 6 HOURS AS NEEDED. What changed:  how much to take how to take this when to take this reasons to take this additional instructions   busPIRone 7.5 MG tablet Commonly known as: BUSPAR Take one tab up to three times daily as needed for anxiety. What changed:  how much to take how to take  this when to take this reasons to take this additional instructions   Junel FE 1.5/30 1.5-30 MG-MCG tablet Generic drug: norethindrone-ethinyl estradiol-iron TAKE 1 TABLET BY MOUTH EVERY DAY   ondansetron 4 MG disintegrating tablet Commonly known as: ZOFRAN-ODT Take 4 mg by mouth every 8 (eight) hours as needed for nausea.   pantoprazole 20 MG tablet Commonly known as: PROTONIX Take 20 mg by mouth daily.   PROBIOTIC DAILY PO Take by mouth.   promethazine 12.5 MG tablet Commonly known as: PHENERGAN Take 1-2 tablets (12.5-25 mg total) by mouth every 6 (six) hours as needed for nausea.   sucralfate 1 GM/10ML suspension Commonly known as: CARAFATE Take 10 mLs by mouth in the morning, at noon, in the evening, and at bedtime.   traMADol 50 MG tablet Commonly known as: ULTRAM Take 1-2 tablets (50-100 mg total) by mouth every 6 (six) hours as needed for moderate pain or severe pain.               Discharge Care Instructions  (From admission, onward)           Start     Ordered   05/10/22 0000  Discharge wound care:       Comments: It is good for closed incisions and even open wounds to be washed every day.  Shower every day.  Short baths are fine.  Wash the incisions and wounds clean with soap & water.    You may leave closed incisions open to air if it is dry.   You may cover the incision with  clean gauze & replace it after your daily shower for comfort.  TEGADERM:  You have clear gauze band-aid dressings over your closed incision(s).  Remove the dressings 3 days after surgery.   05/10/22 0840              Follow-up Information     [facility] Schedule an appointment as soon as possible for a visit in 3 week(s).   Specialty: General Surgery Contact information: [address] [place] [address] [phone]        Charna Elizabeth, MD .   Specialty: Gastroenterology Contact information: [address] [place] [address] [phone]                 Signed: Clarise Cruz [facility] 05/14/2022, 2:37 PM Please see Amion for pager number during day hours 7:00am-4:30pm

## 2022-05-21 IMAGING — DX DG KNEE COMPLETE 4+V*R*
4 series · 4 of 4 positions shown · non-contrast
Comparison: None.

CLINICAL DATA: Bilateral knee pain.

EXAM:
LEFT KNEE - COMPLETE 4+ VIEW; RIGHT KNEE - COMPLETE 4+ VIEW

[knee ap]
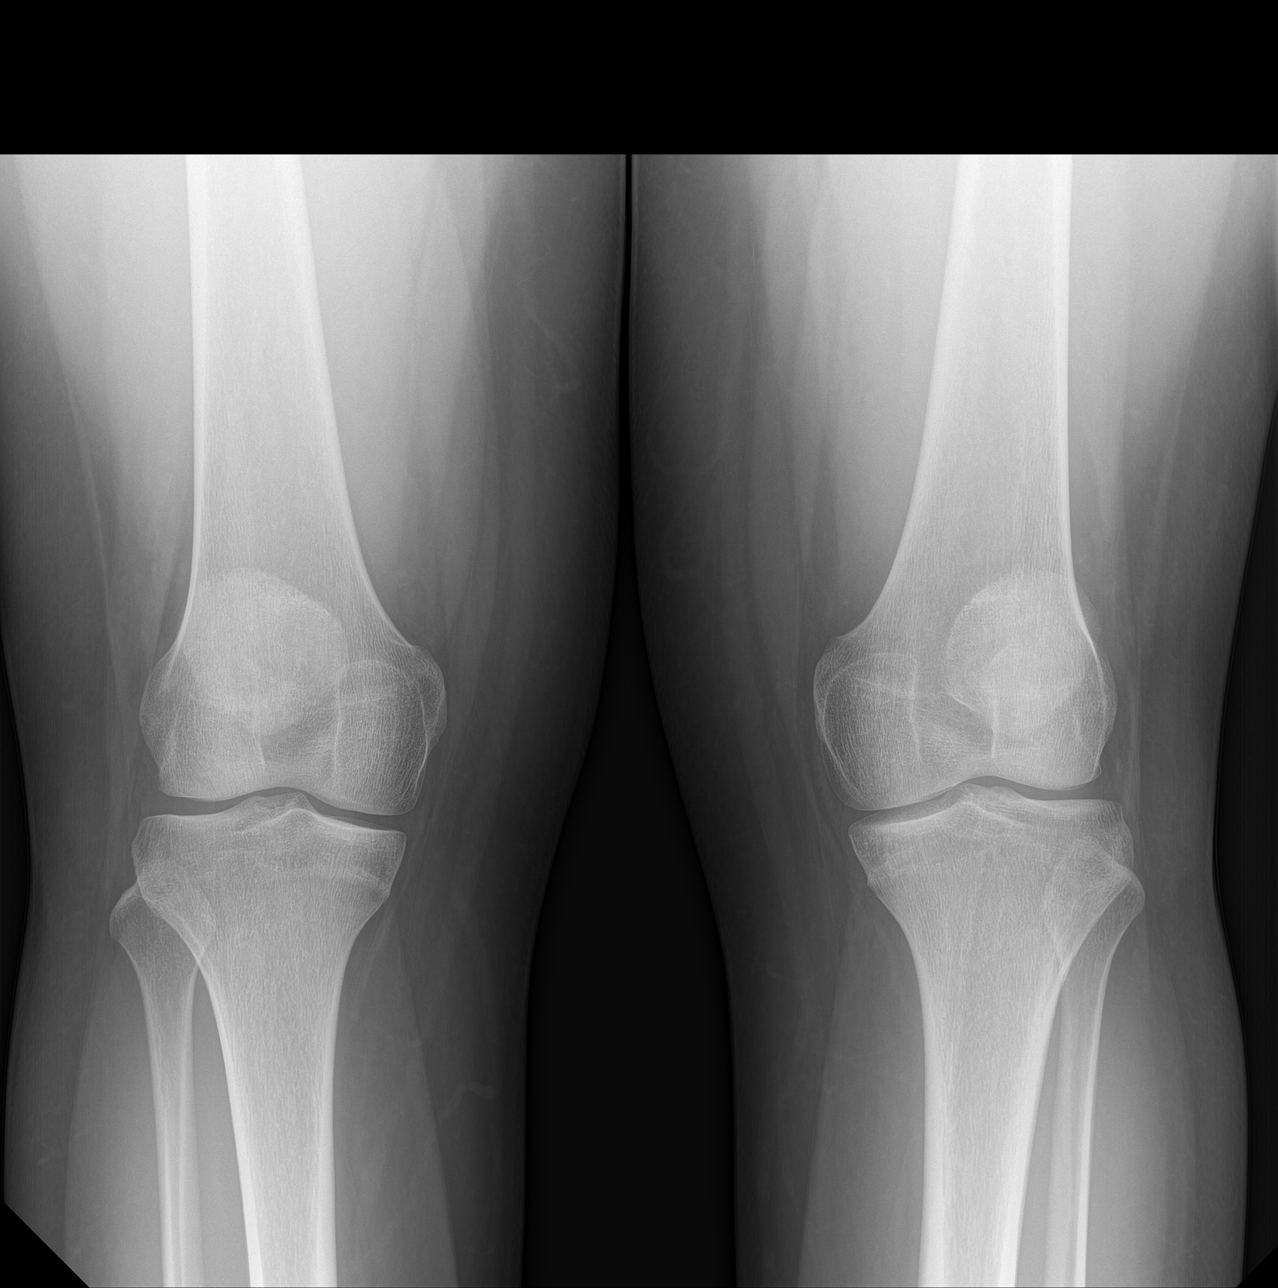

[knee lat]
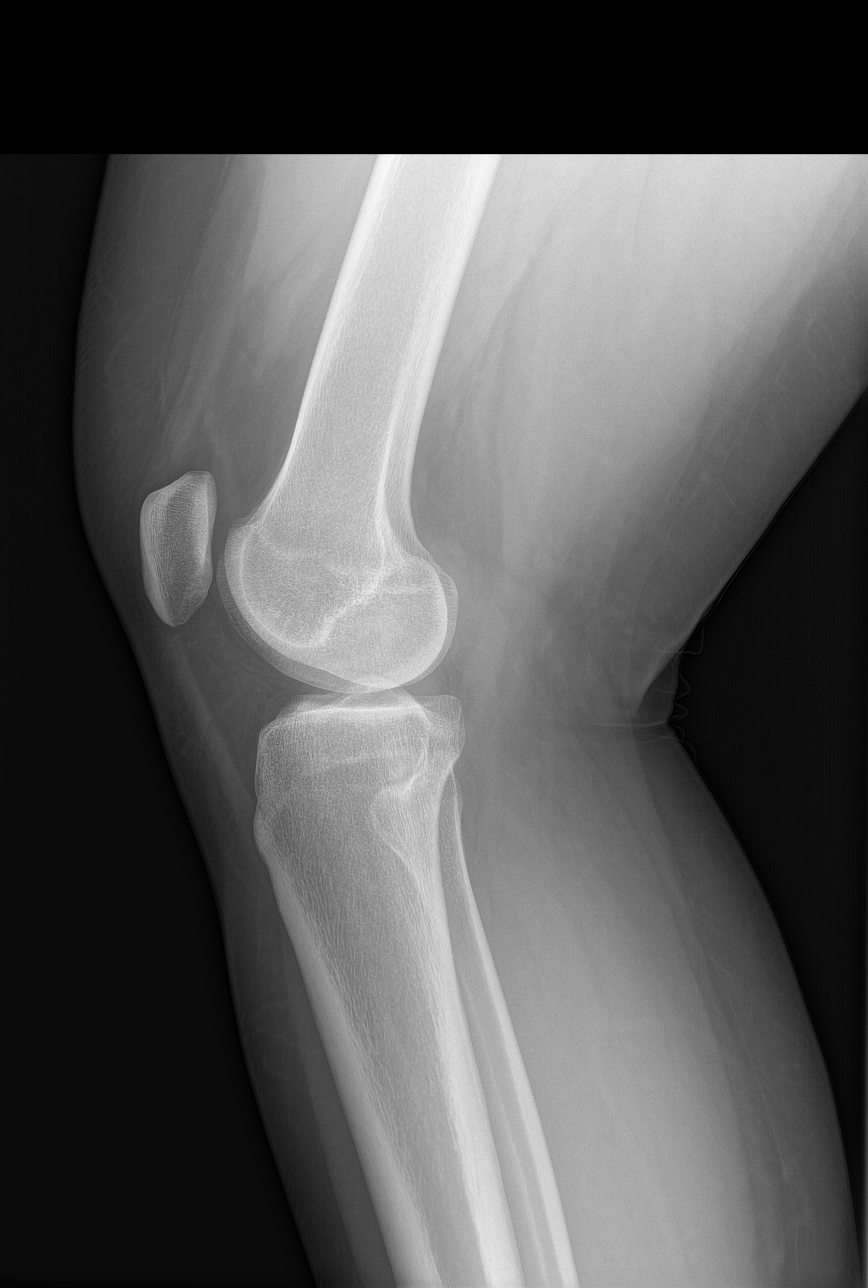

[patella skyline]
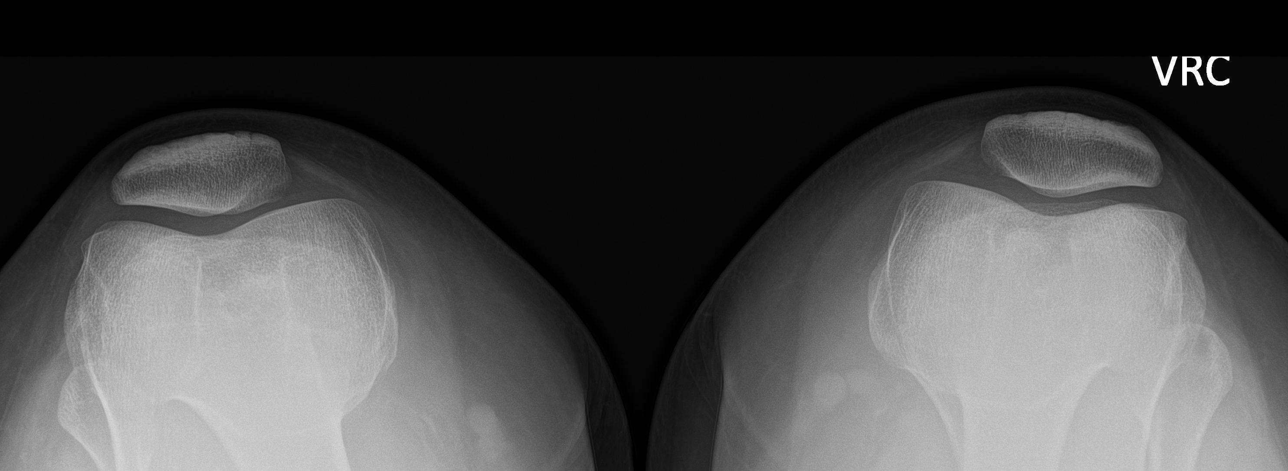

[knee tunnel]
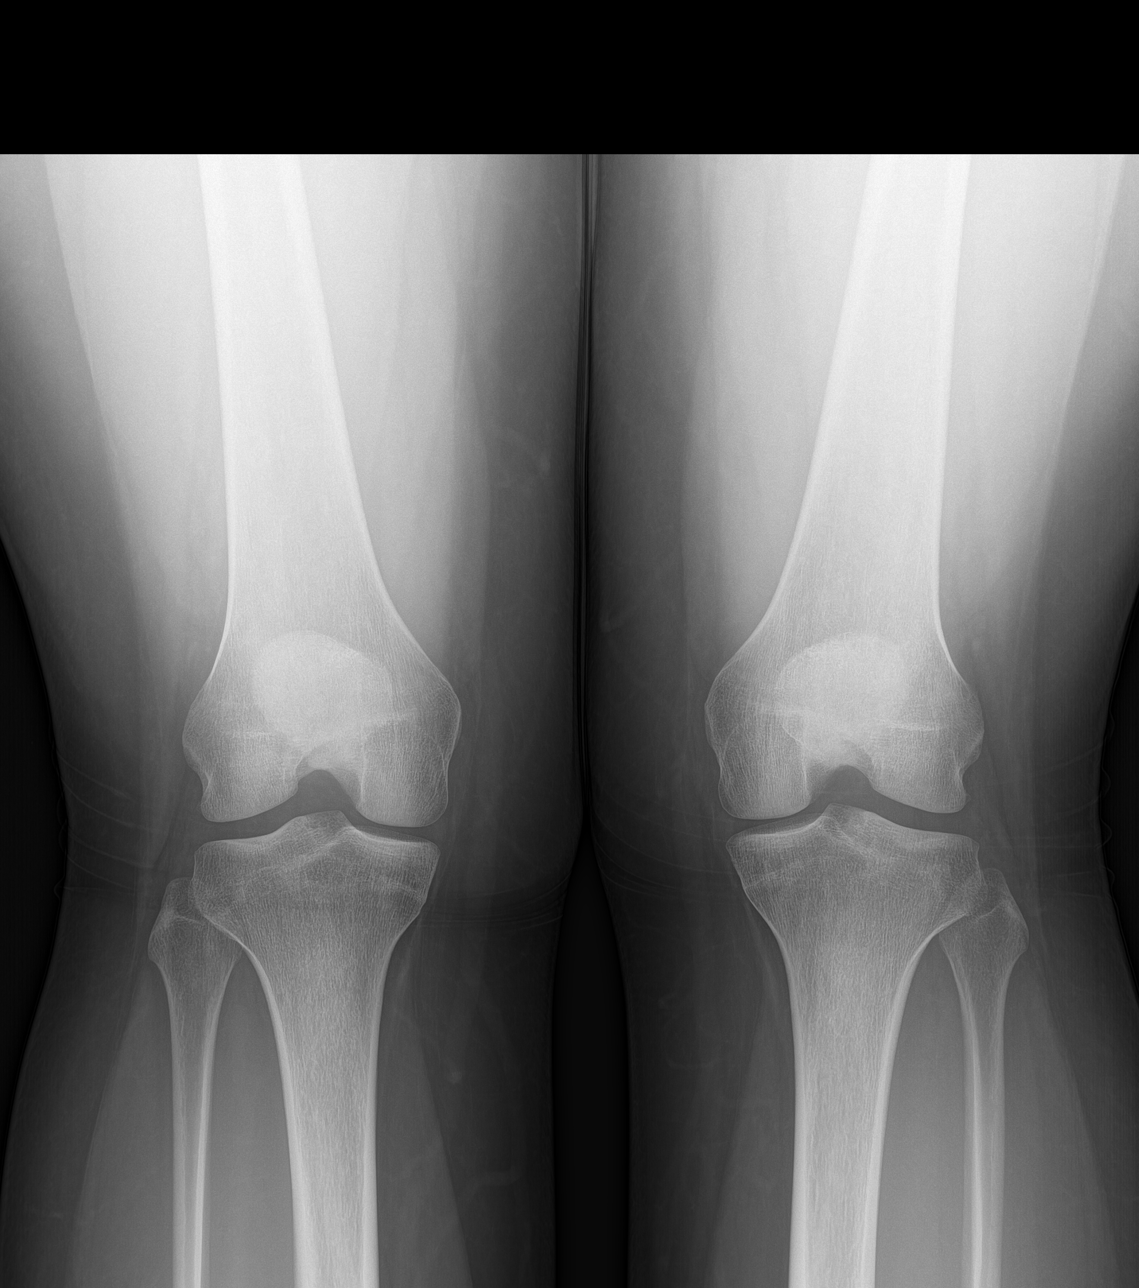

[4 of 4 positions shown; findings below may reference images not displayed]

FINDINGS: Normal bone mineralization. Joint spaces are preserved. Mild
bilateral patella Alta. No joint effusion. No acute fracture or
dislocation.
IMPRESSION: Mild bilateral patella alta.

## 2022-05-21 IMAGING — DX DG KNEE COMPLETE 4+V*L*
4 series · 4 of 4 positions shown · non-contrast
Comparison: None.

CLINICAL DATA: Bilateral knee pain.

EXAM:
LEFT KNEE - COMPLETE 4+ VIEW; RIGHT KNEE - COMPLETE 4+ VIEW

[knee ap]
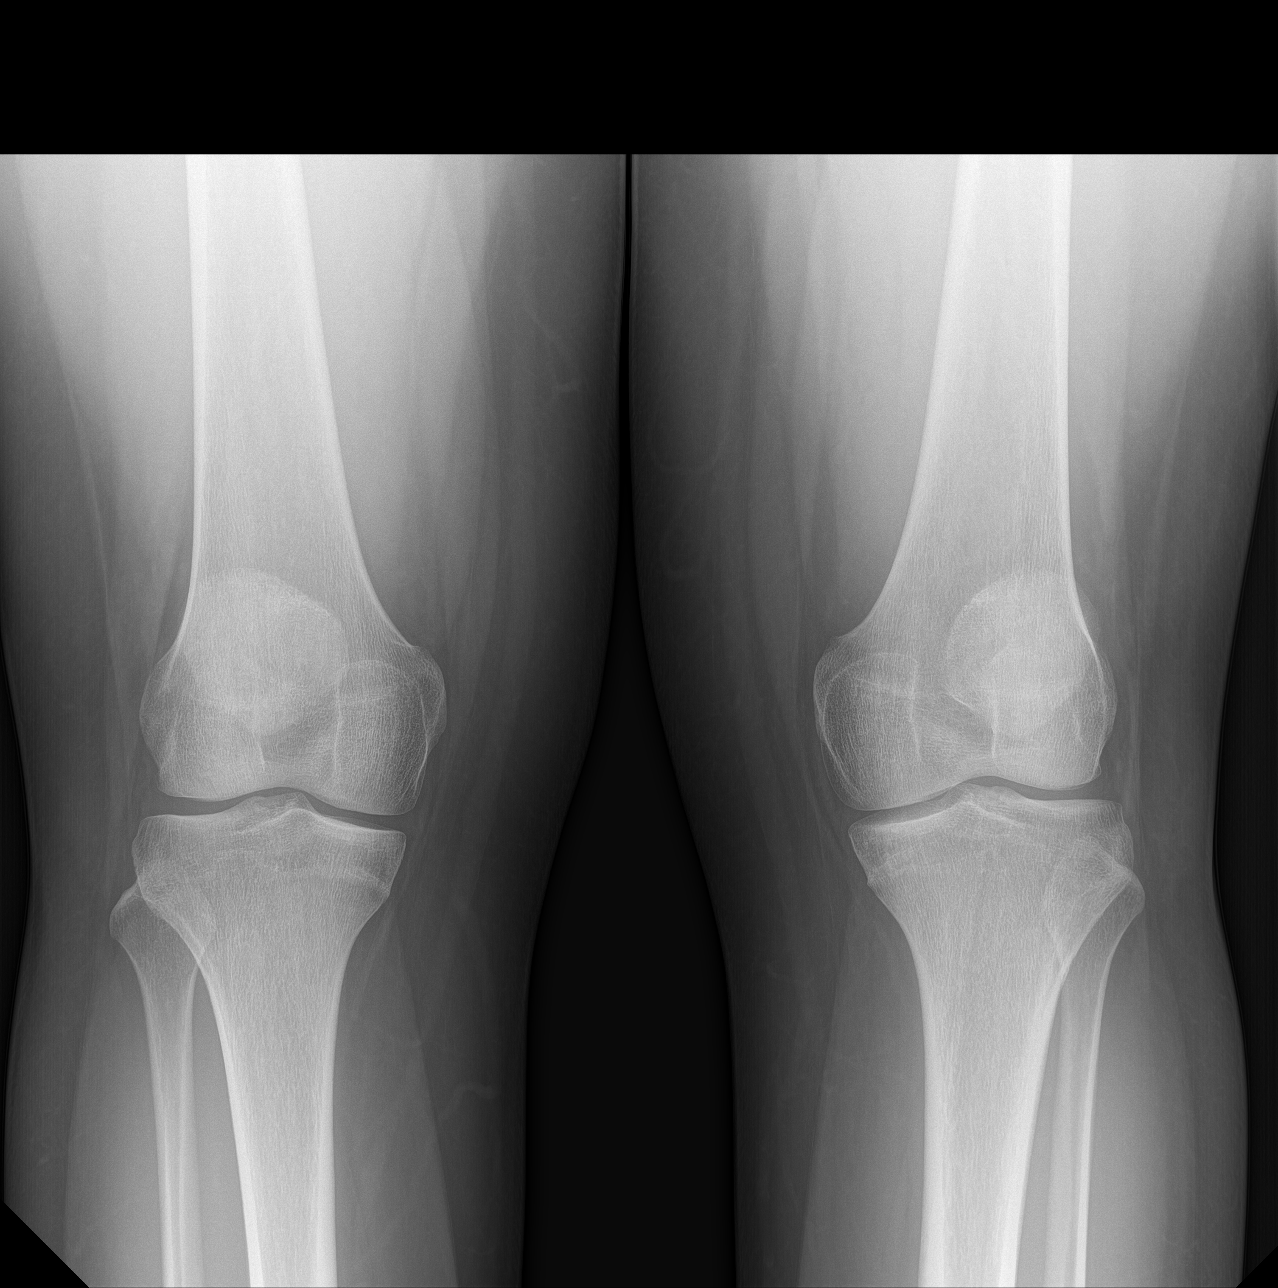

[patella skyline]
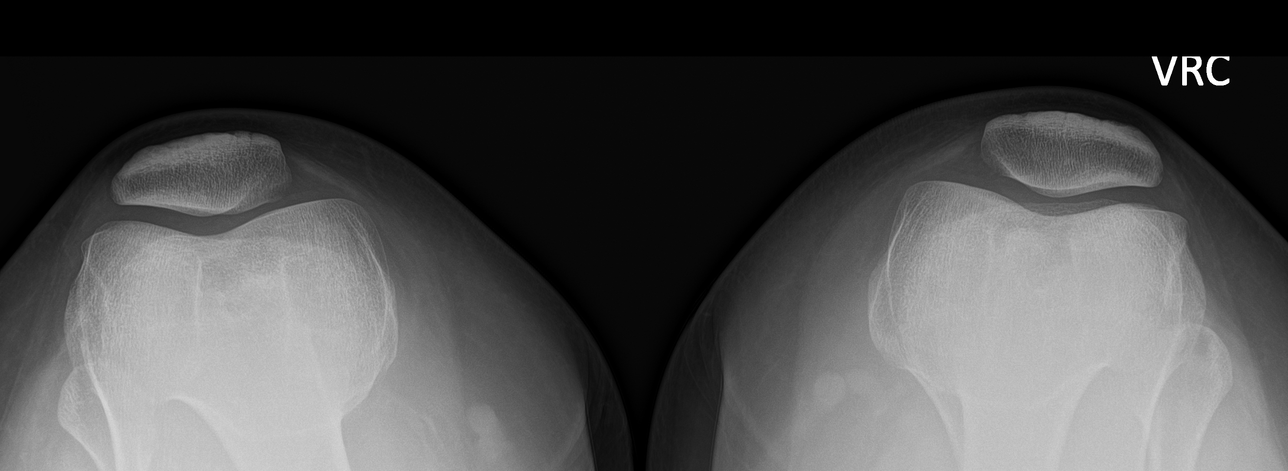

[knee tunnel]
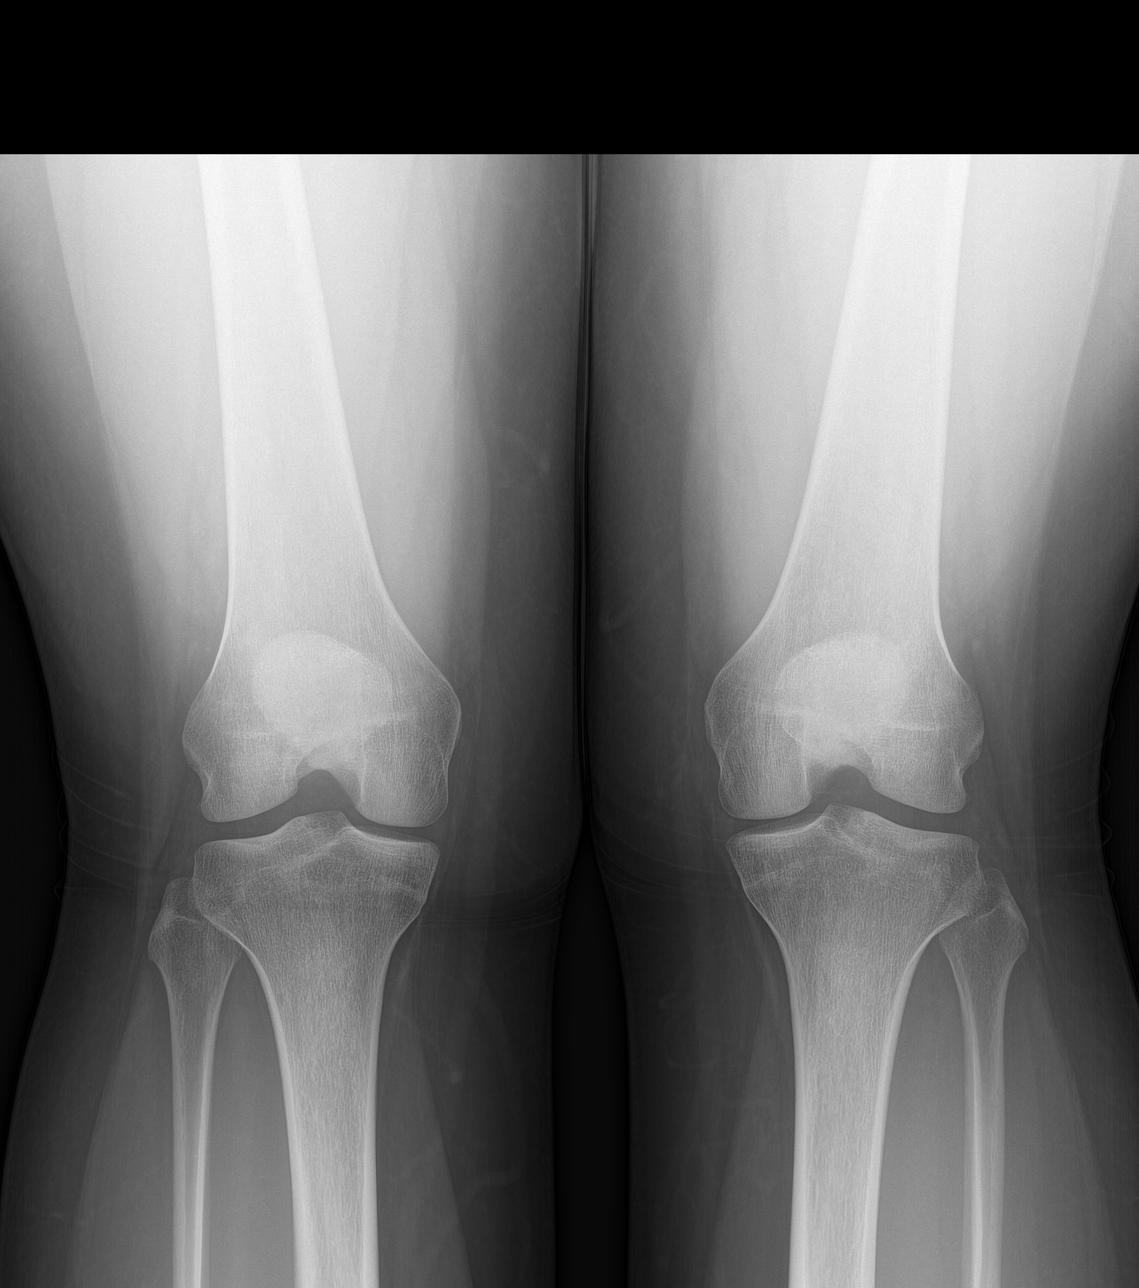

[knee lat]
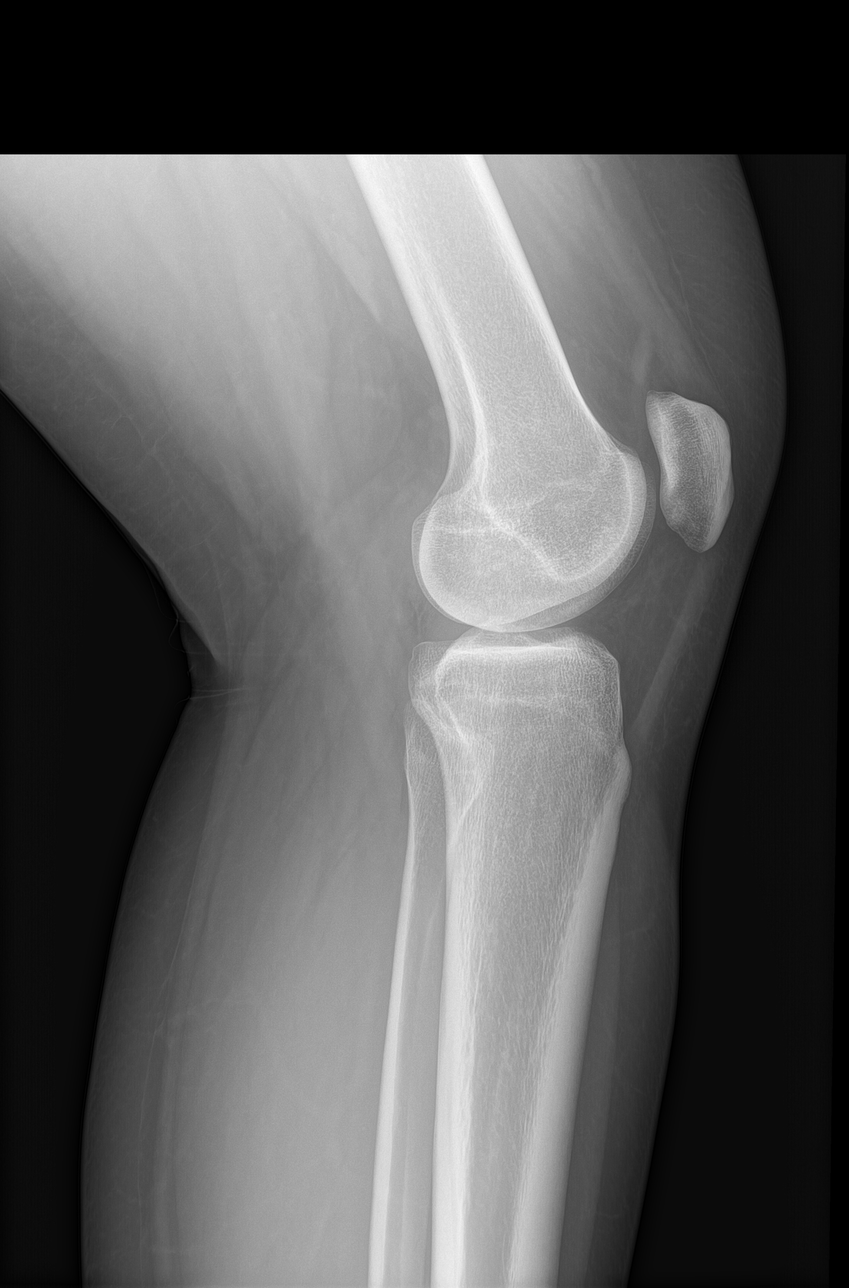

[4 of 4 positions shown; findings below may reference images not displayed]

FINDINGS: Normal bone mineralization. Joint spaces are preserved. Mild
bilateral patella Alta. No joint effusion. No acute fracture or
dislocation.
IMPRESSION: Mild bilateral patella alta.

## 2022-06-09 ENCOUNTER — Encounter: Payer: Self-pay | Admitting: *Deleted

## 2022-06-12 DIAGNOSIS — F4322 Adjustment disorder with anxiety: Secondary | ICD-10-CM | POA: Diagnosis not present

## 2022-07-21 ENCOUNTER — Telehealth (INDEPENDENT_AMBULATORY_CARE_PROVIDER_SITE_OTHER): Payer: BC Managed Care – PPO | Admitting: Physician Assistant

## 2022-07-21 ENCOUNTER — Encounter: Payer: Self-pay | Admitting: Physician Assistant

## 2022-07-21 VITALS — Ht 68.0 in | Wt 230.0 lb

## 2022-07-21 DIAGNOSIS — F419 Anxiety disorder, unspecified: Secondary | ICD-10-CM | POA: Diagnosis not present

## 2022-07-21 DIAGNOSIS — F32A Depression, unspecified: Secondary | ICD-10-CM

## 2022-07-21 MED ORDER — FLUOXETINE HCL 20 MG PO CAPS: 20.0000 mg | ORAL_CAPSULE | Freq: Every morning | ORAL | 0 refills | Status: DC

## 2022-07-21 NOTE — Progress Notes (Signed)
   Virtual Visit via Video Note  I connected with  Morgan Marshall  on 07/21/22 at 11:30 AM EST by a video enabled telemedicine application and verified that I am speaking with the correct person using two identifiers.  Location: Patient: home Provider: Therapist, music at [place] present: Patient and myself   I discussed the limitations of evaluation and management by telemedicine and the availability of in person appointments. The patient expressed understanding and agreed to proceed.   History of Present Illness:  21 yo female presenting via virtual visit to discuss worsening depression. Depression most of her life, worse after laparoscopic cholecystectomy on 05/10/2022.  States that she stopped taking her BuSpar because it felt like things were getting worse. Has been seeing a counselor as well. Journaling daily.  Not feeling motivated.  Living in an apartment by herself with her dog.  Feeling loneliness.  No SI.  Observations/Objective:   Gen: Awake, alert, no acute distress Resp: Breathing is even and non-labored Psych: calm/pleasant demeanor Neuro: Alert and Oriented x 3, + facial symmetry, speech is clear.   Assessment and Plan:  1. Anxiety and depression Patient has already discontinued her BuSpar.  She will start on Prozac 20 mg once daily with breakfast. Pt aware of risks vs benefits and possible adverse reactions.  Encouraged her to get outside daily.  She will continue follow-up with her counselor. ED or behavioral health urgent care center if any SI do arise.   Follow Up Instructions:    I discussed the assessment and treatment plan with the patient. The patient was provided an opportunity to ask questions and all were answered. The patient agreed with the plan and demonstrated an understanding of the instructions.   The patient was advised to call back or seek an in-person evaluation if the symptoms worsen or if the condition fails to improve  as anticipated.  Conan Mcmanaway M Dannel Rafter, PA-C

## 2022-07-22 DIAGNOSIS — F4322 Adjustment disorder with anxiety: Secondary | ICD-10-CM | POA: Diagnosis not present

## 2022-08-12 ENCOUNTER — Other Ambulatory Visit: Payer: Self-pay | Admitting: Physician Assistant

## 2022-08-12 ENCOUNTER — Other Ambulatory Visit: Payer: Self-pay

## 2022-08-13 ENCOUNTER — Encounter: Payer: Self-pay | Admitting: Physician Assistant

## 2022-08-13 ENCOUNTER — Telehealth (INDEPENDENT_AMBULATORY_CARE_PROVIDER_SITE_OTHER): Payer: BC Managed Care – PPO | Admitting: Physician Assistant

## 2022-08-13 VITALS — Ht 68.0 in | Wt 230.0 lb

## 2022-08-13 DIAGNOSIS — Z91018 Allergy to other foods: Secondary | ICD-10-CM

## 2022-08-13 DIAGNOSIS — F419 Anxiety disorder, unspecified: Secondary | ICD-10-CM

## 2022-08-13 DIAGNOSIS — F32A Depression, unspecified: Secondary | ICD-10-CM | POA: Diagnosis not present

## 2022-08-13 MED ORDER — FLUOXETINE HCL 40 MG PO CAPS: 40.0000 mg | ORAL_CAPSULE | Freq: Every day | ORAL | 0 refills | Status: DC

## 2022-08-13 NOTE — Progress Notes (Signed)
   Virtual Visit via Video Note  I connected with  Morgan Marshall  on 08/13/22 at 11:30 AM EST by a video enabled telemedicine application and verified that I am speaking with the correct person using two identifiers.  Location: Patient: home Provider: Nature conservation officer at [employer] Persons present: Patient and myself   I discussed the limitations of evaluation and management by telemedicine and the availability of in person appointments. The patient expressed understanding and agreed to proceed.   History of Present Illness:  21 year old female presents for virtual visit follow-up on anxiety and depression.  Patient is moving in next two weeks out of apartment in [place] and looking for another place due to safety issues. Parents are helping her move. Last week of school this semester. Came home for Thanksgiving, enjoyable time.   Prozac 20 mg is helping her sleep better. No racing / overwhelming thoughts. Feeling more motivated. Starts to wear off after dinner time.  Walks her dog and gets moving / working on school in the mornings. Not sleeping in all morning.   Pt is also wanting to get tested for food allergies. Says certain foods have made her throat feel like it's closing. Benadryl helps. Also noticing hives coming & going. Eating gluten seems to worsen some GI symptoms as well.    Observations/Objective:   Gen: Awake, alert, no acute distress Resp: Breathing is even and non-labored Psych: calm/pleasant demeanor Neuro: Alert and Oriented x 3, + facial symmetry, speech is clear.   Assessment and Plan:  1. Anxiety and depression Definitely seeing improvement.  Plan to increase Prozac to 40 mg daily.  Prescription sent in.  Glad to see her feeling better and more motivated.  She is sleeping better.  Encouraged her to keep working on lifestyle including exercise and good diet.  Plan to follow-up in about 3 months.  2. Multiple food allergies Patient has been  having some concerns about food allergies.  She would like to get allergy tested and she is also wondering about celiac disease, despite markers being negative earlier this year.  Will go ahead and refer to allergy at this time.  Patient will keep Benadryl on hand at all times as this seems to be helping prn.  ER precautions advised.  Patient agreeable and understanding.   Follow Up Instructions:    I discussed the assessment and treatment plan with the patient. The patient was provided an opportunity to ask questions and all were answered. The patient agreed with the plan and demonstrated an understanding of the instructions.   The patient was advised to call back or seek an in-person evaluation if the symptoms worsen or if the condition fails to improve as anticipated.  Satoshi Kalas M Audryana Hockenberry, PA-C

## 2022-09-03 ENCOUNTER — Ambulatory Visit: Payer: BC Managed Care – PPO | Admitting: Physician Assistant

## 2022-09-11 DIAGNOSIS — F4322 Adjustment disorder with anxiety: Secondary | ICD-10-CM | POA: Diagnosis not present

## 2022-09-16 ENCOUNTER — Ambulatory Visit (INDEPENDENT_AMBULATORY_CARE_PROVIDER_SITE_OTHER): Payer: BC Managed Care – PPO

## 2022-09-16 ENCOUNTER — Ambulatory Visit (INDEPENDENT_AMBULATORY_CARE_PROVIDER_SITE_OTHER): Payer: BC Managed Care – PPO | Admitting: Orthopaedic Surgery

## 2022-09-16 ENCOUNTER — Encounter: Payer: Self-pay | Admitting: Orthopaedic Surgery

## 2022-09-16 DIAGNOSIS — M79672 Pain in left foot: Secondary | ICD-10-CM

## 2022-09-16 NOTE — Progress Notes (Signed)
Office Visit Note   Patient: Morgan Marshall           Date of Birth: [birth year]           MRN: [id number] Visit Date: 09/16/2022              Requested by: Allwardt, Randa Evens, PA-C [place],  [place] [id number] PCP: Fredirick Lathe, PA-C   Assessment & Plan: Visit Diagnoses:  1. 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  She says she did have some swelling and bruising but not on the bottom surface of her foot.  Her pain is mostly in the first MTP joint and along the medial side of her foot.  X-rays do not show any abnormalities or alignment issues through the Lisfranc complex or about the great toe metatarsal phalangeal joint.  She is neurovascularly intact although has some numbness once in a while when she is walking.  She has some weakness with flexion of her great toe but can do this even better than she could a couple days ago.  EHL is functioning fine and palpable.  Based on mechanism of injury and exam findings most consistent with a turf toe type mechanism.  Will give her a postoperative shoe see how she does if she does not improve would recommend an MRI over the next 3 to 4 weeks.  She can call to schedule  Follow-Up Instructions: Return in about 2 weeks (around 09/30/2022).   Orders:  Orders Placed This Encounter  Procedures   XR Foot Complete Left   No orders of the defined types were placed in this encounter.     Procedures: No procedures performed   Clinical Data: No additional findings.   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  Mechanism described as a flexion injury of her toes.  Comes in today  with pain mostly in the hallux with radiation down the medial side and plantar surface of her foot.  Did not have significant ecchymosis.  No plantar ecchymosis Review of Systems  All other systems reviewed and are negative.    Objective: Vital Signs: There were no vitals taken for this visit.  Physical Exam Constitutional:      Appearance: Normal appearance.  Pulmonary:     Effort: Pulmonary effort is normal.  Skin:    General: Skin is warm and dry.  Neurological:     Mental Status: She is alert.     Ortho Exam Left foot she has no plantar ecchymosis or ecchymosis throughout her foot.  She does have some mild soft tissue swelling along the medial side of her foot.  She can extend all of her digits and EHL is palpable.  Flexion is weak though improved per her report from a couple days ago.  She does have quite a bit of pain over the first MTP with flexion mild tenderness with manipulation of the midfoot.  Pulses are intact sensation is intact compartments are soft.  No ankle tenderness Specialty Comments:  No specialty comments available.  Imaging: XR Foot Complete Left  Result Date:  09/16/2022 Three-view radiographs of the left foot were reviewed today.  She has well-maintained alignment no evidence of disruption of the Lisfranc complex.  No fractures or avulsion fractures are noted    PMFS History: Patient Active Problem List   Diagnosis Date Noted   Pain in left foot 09/16/2022   Abdominal pain 05/08/2022   Epigastric pain 05/06/2022   Asthma exacerbation, mild 07/18/2021   Acute mucoid otitis media of left ear 07/18/2021   GAD (generalized anxiety disorder) 12/31/2020   Nail avulsion, finger, initial encounter 08/24/2019   Irregular periods 02/17/2018   Asthma 11/17/2017   Childhood obesity 11/17/2017   Past Medical History:  Diagnosis Date   Asthma    Chicken pox    GERD (gastroesophageal reflux disease)    Jaundice    Urinary tract infection     Family  History  Problem Relation Age of Onset   Heart attack Mother    Hyperlipidemia Father    Asthma Brother    Arthritis Maternal Grandmother    Asthma Maternal Grandmother    Hyperlipidemia Maternal Grandmother    Hyperlipidemia Maternal Grandfather    Hyperlipidemia Paternal Grandmother    Early death Paternal Grandfather     Past Surgical History:  Procedure Laterality Date   CHOLECYSTECTOMY N/A 05/10/2022   Procedure: LAPAROSCOPIC CHOLECYSTECTOMY WITH INTRAOPERATIVE CHOLANGIOGRAM;  Surgeon: Michael Boston, MD;  Location: [facility];  Service: General;  Laterality: N/A;   ESOPHAGOGASTRODUODENOSCOPY N/A 05/07/2022   Procedure: ESOPHAGOGASTRODUODENOSCOPY (EGD);  Surgeon: Juanita Craver, MD;  Location: [facility];  Service: Gastroenterology;  Laterality: N/A;  epigastric pain, nausea and vomiting   TYMPANOSTOMY TUBE PLACEMENT     Social History   Occupational History   Not on file  Tobacco Use   Smoking status: Never   Smokeless tobacco: Never  Vaping Use   Vaping Use: Never used  Substance and Sexual Activity   Alcohol use: Not Currently   Drug use: Not Currently   Sexual activity: Not on file

## 2022-09-19 ENCOUNTER — Ambulatory Visit (HOSPITAL_BASED_OUTPATIENT_CLINIC_OR_DEPARTMENT_OTHER): Payer: BC Managed Care – PPO | Admitting: Orthopaedic Surgery

## 2022-09-23 DIAGNOSIS — U071 COVID-19: Secondary | ICD-10-CM | POA: Diagnosis not present

## 2022-09-23 DIAGNOSIS — M791 Myalgia, unspecified site: Secondary | ICD-10-CM | POA: Diagnosis not present

## 2022-09-23 DIAGNOSIS — R509 Fever, unspecified: Secondary | ICD-10-CM | POA: Diagnosis not present

## 2022-11-10 ENCOUNTER — Other Ambulatory Visit: Payer: Self-pay | Admitting: Physician Assistant

## 2022-11-17 ENCOUNTER — Other Ambulatory Visit: Payer: Self-pay

## 2022-11-17 ENCOUNTER — Encounter: Payer: Self-pay | Admitting: Allergy

## 2022-11-17 ENCOUNTER — Ambulatory Visit (INDEPENDENT_AMBULATORY_CARE_PROVIDER_SITE_OTHER): Payer: BC Managed Care – PPO | Admitting: Allergy

## 2022-11-17 VITALS — BP 128/72 | HR 92 | Temp 98.4°F | Resp 20 | Ht 68.0 in | Wt 233.5 lb

## 2022-11-17 DIAGNOSIS — Z91040 Latex allergy status: Secondary | ICD-10-CM | POA: Insufficient documentation

## 2022-11-17 DIAGNOSIS — L299 Pruritus, unspecified: Secondary | ICD-10-CM

## 2022-11-17 DIAGNOSIS — J3089 Other allergic rhinitis: Secondary | ICD-10-CM

## 2022-11-17 DIAGNOSIS — J4520 Mild intermittent asthma, uncomplicated: Secondary | ICD-10-CM | POA: Diagnosis not present

## 2022-11-17 DIAGNOSIS — L253 Unspecified contact dermatitis due to other chemical products: Secondary | ICD-10-CM

## 2022-11-17 DIAGNOSIS — R21 Rash and other nonspecific skin eruption: Secondary | ICD-10-CM | POA: Diagnosis not present

## 2022-11-17 DIAGNOSIS — L509 Urticaria, unspecified: Secondary | ICD-10-CM

## 2022-11-17 DIAGNOSIS — K9049 Malabsorption due to intolerance, not elsewhere classified: Secondary | ICD-10-CM | POA: Diagnosis not present

## 2022-11-17 NOTE — Progress Notes (Signed)
New Patient Note  RE: Morgan Marshall MRN: [id number] DOB: February 23, 2001 Date of Office Visit: 11/17/2022  Consult requested by: Allwardt, Randa Evens, PA-C Primary care provider: Allwardt, Randa Evens, PA-C  Chief Complaint: Allergy Testing (Environmental and foods.)  History of Present Illness: I had the pleasure of seeing Morgan Marshall for initial evaluation at the [facility] on 11/17/2022. She is a 22 y.o. female, who is referred here by Allwardt, Randa Evens, PA-C for the evaluation of food allergies, rash. She is accompanied today by her mother who provided/contributed to the history.   Skin Rash/itching started about 6 months ago but had issues in the past with this. Mainly occurs on her legs. Describes them as itchy, red, flat. Individual rashes lasts about a few hours. No ecchymosis upon resolution. Associated symptoms include: none.  Frequency of episodes: at least twice per week. Suspected triggers are unknown but worse when outdoors. Denies any fevers, chills, changes in medications, foods, personal care products or recent infections.  Patient had cholecystectomy in August 2023.  She has tried the following therapies: zyrtec with some benefit. Systemic steroids no. Previous work up includes: none. Previous history of rash/hives: as a child, issues with latex.  Patient is up to date with the following cancer screening tests: physical exam.  Food Patient is trying avoid gluten as she has positive "markers". She does get diarrhea if she eats gluten. Patient has lactose intolerance but still eats cheese which causes GI symptoms. She tolerates lactose free milk with no issues.   Past work up includes: none. Dietary History: patient has been eating other foods including lactose free milk, limited eggs, peanut, treenuts, sesame, shellfish, fish, soy, meats, fruits and vegetables.  Currently avoiding gluten.   Reviewed images on the phone - erythematous patch on the anterior  neck.  Patient goes to school at [employer] and moved to a different rental home which apparently has mold issues as well. She also got 1 new dog in the summer 2023  08/13/2022 PCP visit: "Pt is also wanting to get tested for food allergies. Says certain foods have made her throat feel like it's closing. Benadryl helps. Also noticing hives coming & going. Eating gluten seems to worsen some GI symptoms as well."  Assessment and Plan: Morgan Marshall is a 22 y.o. female with: Rash and other nonspecific skin eruption History of rash/hives but the past 6 months noted a flare lasting a few hours at a time. Occurs about twice per week and worse when outdoors. Had [place] in August 2023 and new dog in summer 2023. Concerned about allergic triggers. Today's skin prick testing showed: Borderline positive to one weed pollen. Negative to common foods.  Discussed with mom and patient that above allergens does not explain her symptoms. Etiology is unclear. Keep track of episodes and take pictures. Start zyrtec (cetirizine) '10mg'$  OR allegra (fexofenadine) '180mg'$  once a day at night.  If symptoms are not controlled or causes drowsiness let us know. Avoid the following potential triggers: alcohol, tight clothing, NSAIDs, hot showers and getting overheated. See below for proper skin care.  Get bloodwork to rule out other etiologies.  Food intolerance Gluten causes GI symptoms and so does dairy. Tolerates lactose free milk. Apparently had markers for celiac's in the past. Sometimes notices throat itching/trouble breathing after eating but can't pinpoint to a specific food. Denies reflux.  Today's skin testing showed: Negative to common foods.  Keep food journal.  Gluten  Continue to avoid.  Lactose intolerance  May use lactose free milk or take a lactaid pill right before consuming anything with dairy.  Other allergic rhinitis Symptoms in the spring and fall. Takes OTC antihistamines and Flonase prn with minimal  benefit. Today's skin prick testing showed: Borderline positive to one weed pollen. Start environmental control measures as below. Will double check via bloodwork.  Use over the counter antihistamines such as Zyrtec (cetirizine), Claritin (loratadine), Allegra (fexofenadine), or Xyzal (levocetirizine) daily as needed. May take twice a day during allergy flares. May switch antihistamines every few months. Use Flonase (fluticasone) nasal spray 1 spray per nostril twice a day as needed for nasal congestion.  Nasal saline spray (i.e., Simply Saline) or nasal saline lavage (i.e., NeilMed) is recommended as needed and prior to medicated nasal sprays.  Mild intermittent reactive airway disease Mainly sports induced but needed albuterol with recent Covid-19 infection. Vapes.  Today's spirometry was unremarkable. May use albuterol rescue inhaler 2 puffs every 4 to 6 hours as needed for shortness of breath, chest tightness, coughing, and wheezing. May use albuterol rescue inhaler 2 puffs 5 to 15 minutes prior to strenuous physical activities. Monitor frequency of use.   Latex allergy, contact dermatitis Mainly has rash. Continue to avoid.  Offered to check latex IgE - declined.   Return in about 3 months (around 02/17/2023).  No orders of the defined types were placed in this encounter.  Lab Orders         Alpha-Gal Panel         ANA w/Reflex         CBC with Differential/Platelet         C3 and C4         Chronic Urticaria         Comprehensive metabolic panel         C-reactive protein         Sedimentation rate         Thyroid Cascade Profile         Tryptase         Allergens w/Total IgE Area 2      Other allergy screening: Asthma: yes Mainly sports induced with difficulty breathing when pollen was high outdoors. She was on albuterol when she had Covid recently.  Rhino conjunctivitis: yes Some rhinitis symptoms in the spring and fall. Tried OTC antihistamines, fluticasone nasal  spray with minimal benefit. No prior allergy testing.   Medication allergy: yes Latex - rash.  Hymenoptera allergy: no Eczema: yes In the past.  History of recurrent infections suggestive of immunodeficency: no  Diagnostics: Spirometry:  Tracings reviewed. Her effort: Good reproducible efforts. FVC: 4.69L FEV1: 4.07L, 124% predicted FEV1/FVC ratio: 87% Interpretation: No overt abnormalities noted given today's efforts.  Please see scanned spirometry results for details.  Skin Testing: Environmental allergy panel and select foods. Borderline positive to one weed pollen. Negative to common foods.  Results discussed with patient/family.  Airborne Adult Perc - 11/17/22 1501     Time Antigen Placed 1501    Allergen Manufacturer Lavella Hammock    Location Back    Number of Test 59    1. Control-Buffer 50% Glycerol Negative    2. Control-Histamine 1 mg/ml 3+    3. Albumin saline Negative    4. [place] Negative    5. [place] Negative    6. Johnson Negative    7. [place] Blue Negative    8. Meadow Fescue Negative    9. Perennial Rye Negative    10. Sweet Vernal  Negative    11. Timothy Negative    12. Cocklebur Negative    14. Ragweed, short Negative    15. Ragweed, Giant Negative    16. Plantain,  English Negative    17. Lamb's Quarters Negative    18. Sheep Sorrell Negative    19. Rough Pigweed Negative    20. Marsh Elder, Rough Negative    21. Mugwort, Common --   +/-   22. Ash mix Negative    23. Birch mix Negative    24. Beech American Negative    25. Box, Elder Negative    26. Cedar, red Negative    27. Cottonwood, [place] Negative    28. Elm mix Negative    29. Hickory Negative    30. Maple mix Negative    31. Oak, [place] mix Negative    32. Pecan Pollen Negative    33. Pine mix Negative    34. Sycamore Eastern Negative    35. [place], Black Pollen Negative    36. Alternaria alternata Negative    37. Cladosporium Herbarum Negative    38. Aspergillus mix Negative     39. Penicillium mix Negative    40. Bipolaris sorokiniana (Helminthosporium) Negative    41. Drechslera spicifera (Curvularia) Negative    42. Mucor plumbeus Negative    43. Fusarium moniliforme Negative    44. Aureobasidium pullulans (pullulara) Negative    45. Rhizopus oryzae Negative    46. Botrytis cinera Negative    47. Epicoccum nigrum Negative    48. Phoma betae Negative    49. Candida Albicans Negative    50. Trichophyton mentagrophytes Negative    51. Mite, D Farinae  5,000 AU/ml Negative    52. Mite, D Pteronyssinus  5,000 AU/ml Negative    53. Cat Hair 10,000 BAU/ml Negative    54.  Dog Epithelia Negative    55. Mixed Feathers Negative    56. Horse Epithelia Negative    57. Cockroach, German Negative    58. Mouse Negative    59. Tobacco Leaf Negative             Food Perc - 11/17/22 1537       Food   1. Peanut Negative    2. Soybean food Negative    3. Wheat, whole Negative    4. Sesame Negative    5. Milk, cow Negative    6. Egg White, chicken Negative    7. Casein Negative    8. Shellfish mix Negative    9. Fish mix Negative    10. Cashew Negative             Past Medical History: Patient Active Problem List   Diagnosis Date Noted   Rash and other nonspecific skin eruption 11/17/2022   Mild intermittent reactive airway disease 11/17/2022   Other allergic rhinitis 11/17/2022   Food intolerance 11/17/2022   Latex allergy, contact dermatitis 11/17/2022   Pain in left foot 09/16/2022   Abdominal pain 05/08/2022   Epigastric pain 05/06/2022   Asthma exacerbation, mild 07/18/2021   Acute mucoid otitis media of left ear 07/18/2021   GAD (generalized anxiety disorder) 12/31/2020   Nail avulsion, finger, initial encounter 08/24/2019   Irregular periods 02/17/2018   Asthma 11/17/2017   Childhood obesity 11/17/2017   Past Medical History:  Diagnosis Date   Asthma    Chicken pox    GERD (gastroesophageal reflux disease)    Jaundice     Urinary tract infection  Past Surgical History: Past Surgical History:  Procedure Laterality Date   CHOLECYSTECTOMY N/A 05/10/2022   Procedure: LAPAROSCOPIC CHOLECYSTECTOMY WITH INTRAOPERATIVE CHOLANGIOGRAM;  Surgeon: Michael Boston, MD;  Location: [facility];  Service: General;  Laterality: N/A;   ESOPHAGOGASTRODUODENOSCOPY N/A 05/07/2022   Procedure: ESOPHAGOGASTRODUODENOSCOPY (EGD);  Surgeon: Juanita Craver, MD;  Location: [facility];  Service: Gastroenterology;  Laterality: N/A;  epigastric pain, nausea and vomiting   TYMPANOSTOMY TUBE PLACEMENT     Medication List:  Current Outpatient Medications  Medication Sig Dispense Refill   albuterol (VENTOLIN HFA) 108 (90 Base) MCG/ACT inhaler INHALE 1-2 PUFFS INTO THE LUNGS EVERY 6 HOURS AS NEEDED. (Patient taking differently: Inhale 1-2 puffs into the lungs every 6 (six) hours as needed for wheezing or shortness of breath.) 18 each 0   FLUoxetine (PROZAC) 40 MG capsule TAKE 1 CAPSULE (40 MG TOTAL) BY MOUTH DAILY. 90 capsule 0   JUNEL FE 1.5/30 1.5-30 MG-MCG tablet TAKE 1 TABLET BY MOUTH EVERY DAY 84 tablet 3   Probiotic Product (PROBIOTIC DAILY PO) Take by mouth.     No current facility-administered medications for this visit.   Allergies: Allergies  Allergen Reactions   Latex Hives   Social History: Social History   Socioeconomic History   Marital status: Single    Spouse name: Not on file   Number of children: Not on file   Years of education: Not on file   Highest education level: Not on file  Occupational History   Not on file  Tobacco Use   Smoking status: Never   Smokeless tobacco: Never  Vaping Use   Vaping Use: Never used  Substance and Sexual Activity   Alcohol use: Not Currently   Drug use: Not Currently   Sexual activity: Not on file  Other Topics Concern   Not on file  Social History Narrative   Not on file   Social Determinants of Health   Financial Resource Strain: Not on file  Food Insecurity: Not on file   Transportation Needs: Not on file  Physical Activity: Not on file  Stress: Not on file  Social Connections: Not on file   Lives in a 20 year old house. Smoking: vapes Occupation: Market researcher HistoryFreight forwarder in the house: no Charity fundraiser in the family room: no Carpet in the bedroom: no Heating: gas Cooling: central Pet: yes 1 dog  x at school, 1 dog and 1 cat at home.   Family History: Family History  Problem Relation Age of Onset   Heart attack Mother    Hyperlipidemia Father    Asthma Brother    Arthritis Maternal Grandmother    Asthma Maternal Grandmother    Hyperlipidemia Maternal Grandmother    Hyperlipidemia Maternal Grandfather    Hyperlipidemia Paternal Grandmother    Early death Paternal Grandfather    Review of Systems  Constitutional:  Negative for appetite change, chills, fever and unexpected weight change.  HENT:  Negative for congestion and rhinorrhea.   Eyes:  Negative for itching.  Respiratory:  Negative for cough, chest tightness, shortness of breath and wheezing.   Cardiovascular:  Negative for chest pain.  Gastrointestinal:  Negative for abdominal pain.  Genitourinary:  Negative for difficulty urinating.  Skin:  Positive for rash.  Neurological:  Negative for headaches.    Objective: BP 128/72   Pulse 92   Temp 98.4 F (36.9 C)   Resp 20   Ht '5\' 8"'$  (1.727 m)   Wt 233 lb 8 oz (105.9 kg)  SpO2 99%   BMI 35.50 kg/m  Body mass index is 35.5 kg/m. Physical Exam Vitals and nursing note reviewed.  Constitutional:      Appearance: Normal appearance. She is well-developed.  HENT:     Head: Normocephalic and atraumatic.     Right Ear: Tympanic membrane and external ear normal.     Left Ear: Tympanic membrane and external ear normal.     Nose: Nose normal.     Mouth/Throat:     Mouth: Mucous membranes are moist.     Pharynx: Oropharynx is clear.  Eyes:     Conjunctiva/sclera: Conjunctivae normal.  Cardiovascular:      Rate and Rhythm: Normal rate and regular rhythm.     Heart sounds: Normal heart sounds. No murmur heard.    No friction rub. No gallop.  Pulmonary:     Effort: Pulmonary effort is normal.     Breath sounds: Normal breath sounds. No wheezing, rhonchi or rales.  Musculoskeletal:     Cervical back: Neck supple.  Skin:    General: Skin is warm.     Findings: No rash.  Neurological:     Mental Status: She is alert and oriented to person, place, and time.  Psychiatric:        Behavior: Behavior normal.   The plan was reviewed with the patient/family, and all questions/concerned were addressed.  It was my pleasure to see Morgan Marshall today and participate in her care. Please feel free to contact me with any questions or concerns.  Sincerely,  Rexene Alberts, DO Allergy & Immunology  [facility] office: [place] office: [phone]

## 2022-11-17 NOTE — Assessment & Plan Note (Signed)
History of rash/hives but the past 6 months noted a flare lasting a few hours at a time. Occurs about twice per week and worse when outdoors. Had [place] in August 2023 and new dog in summer 2023. Concerned about allergic triggers. Today's skin prick testing showed: Borderline positive to one weed pollen. Negative to common foods.  Discussed with mom and patient that above allergens does not explain her symptoms. Etiology is unclear. Keep track of episodes and take pictures. Start zyrtec (cetirizine) '10mg'$  OR allegra (fexofenadine) '180mg'$  once a day at night.  If symptoms are not controlled or causes drowsiness let us know. Avoid the following potential triggers: alcohol, tight clothing, NSAIDs, hot showers and getting overheated. See below for proper skin care.  Get bloodwork to rule out other etiologies.

## 2022-11-17 NOTE — Patient Instructions (Addendum)
Today's skin testing showed: Borderline positive to one weed pollen. Negative to common foods.   Results given.  Rash/itching Etiology unclear. Keep track of episodes and take pictures. Start zyrtec (cetirizine) '10mg'$  OR allegra (fexofenadine) '180mg'$  once a day at night.  If symptoms are not controlled or causes drowsiness let us know. Avoid the following potential triggers: alcohol, tight clothing, NSAIDs, hot showers and getting overheated. See below for proper skin care.  Get bloodwork:  We are ordering labs, so please allow 1-2 weeks for the results to come back. With the newly implemented Cures Act, the labs might be visible to you at the same time that they become visible to me. However, I will not address the results until all of the results are back, so please be patient.   Rhinitis  Start environmental control measures as below. Will double check via bloodwork.  Use over the counter antihistamines such as Zyrtec (cetirizine), Claritin (loratadine), Allegra (fexofenadine), or Xyzal (levocetirizine) daily as needed. May take twice a day during allergy flares. May switch antihistamines every few months. Use Flonase (fluticasone) nasal spray 1 spray per nostril twice a day as needed for nasal congestion.  Nasal saline spray (i.e., Simply Saline) or nasal saline lavage (i.e., NeilMed) is recommended as needed and prior to medicated nasal sprays.  Food Gluten  Continue to avoid.  Lactose intolerance May use lactose free milk or take a lactaid pill right before consuming anything with dairy.  Asthma Normal breathing test today. May use albuterol rescue inhaler 2 puffs every 4 to 6 hours as needed for shortness of breath, chest tightness, coughing, and wheezing. May use albuterol rescue inhaler 2 puffs 5 to 15 minutes prior to strenuous physical activities. Monitor frequency of use.   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   Follow up in 3 months or sooner if needed.    Skin care  recommendations  Bath time: Always use lukewarm water. AVOID very hot or cold water. Keep bathing time to 5-10 minutes. Do NOT use bubble bath. Use a mild soap and use just enough to wash the dirty areas. Do NOT scrub skin vigorously.  After bathing, pat dry your skin with a towel. Do NOT rub or scrub the skin.  Moisturizers and prescriptions:  ALWAYS apply moisturizers immediately after bathing (within 3 minutes). This helps to lock-in moisture. Use the moisturizer several times a day over the whole body. Good summer moisturizers include: Aveeno, CeraVe, Cetaphil. Good winter moisturizers include: Aquaphor, Vaseline, Cerave, Cetaphil, Eucerin, Vanicream. When using moisturizers along with medications, the moisturizer should be applied about one hour after applying the medication to prevent diluting effect of the medication or moisturize around where you applied the medications. When not using medications, the moisturizer can be continued twice daily as maintenance.  Laundry and clothing: Avoid laundry products with added color or perfumes. Use unscented hypo-allergenic laundry products such as Tide free, Cheer free & gentle, and All free and clear.  If the skin still seems dry or sensitive, you can try double-rinsing the clothes. Avoid tight or scratchy clothing such as wool. Do not use fabric softeners or dyer sheets.   Reducing Pollen Exposure Pollen seasons: trees (spring), grass (summer) and ragweed/weeds (fall). Keep windows closed in your home and car to lower pollen exposure.  Install air conditioning in the bedroom and throughout the house if possible.  Avoid going out in dry windy days - especially early morning. Pollen counts are highest between 5 - 10 AM and on dry, hot  and windy days.  Save outside activities for late afternoon or after a heavy rain, when pollen levels are lower.  Avoid mowing of grass if you have grass pollen allergy. Be aware that pollen can also be  transported indoors on people and pets.  Dry your clothes in an automatic dryer rather than hanging them outside where they might collect pollen.  Rinse hair and eyes before bedtime.

## 2022-11-17 NOTE — Assessment & Plan Note (Signed)
Symptoms in the spring and fall. Takes OTC antihistamines and Flonase prn with minimal benefit. Today's skin prick testing showed: Borderline positive to one weed pollen. Start environmental control measures as below. Will double check via bloodwork.  Use over the counter antihistamines such as Zyrtec (cetirizine), Claritin (loratadine), Allegra (fexofenadine), or Xyzal (levocetirizine) daily as needed. May take twice a day during allergy flares. May switch antihistamines every few months. Use Flonase (fluticasone) nasal spray 1 spray per nostril twice a day as needed for nasal congestion.  Nasal saline spray (i.e., Simply Saline) or nasal saline lavage (i.e., NeilMed) is recommended as needed and prior to medicated nasal sprays.

## 2022-11-17 NOTE — Assessment & Plan Note (Signed)
Gluten causes GI symptoms and so does dairy. Tolerates lactose free milk. Apparently had markers for celiac's in the past. Sometimes notices throat itching/trouble breathing after eating but can't pinpoint to a specific food. Denies reflux.  Today's skin testing showed: Negative to common foods.  Keep food journal.  Gluten  Continue to avoid.  Lactose intolerance May use lactose free milk or take a lactaid pill right before consuming anything with dairy.

## 2022-11-17 NOTE — Assessment & Plan Note (Addendum)
Mainly sports induced but needed albuterol with recent Covid-19 infection. Vapes.  Today's spirometry was unremarkable. May use albuterol rescue inhaler 2 puffs every 4 to 6 hours as needed for shortness of breath, chest tightness, coughing, and wheezing. May use albuterol rescue inhaler 2 puffs 5 to 15 minutes prior to strenuous physical activities. Monitor frequency of use.

## 2022-11-17 NOTE — Assessment & Plan Note (Signed)
Mainly has rash. Continue to avoid.  Offered to check latex IgE - declined.

## 2022-11-21 ENCOUNTER — Other Ambulatory Visit: Payer: Self-pay

## 2022-11-21 ENCOUNTER — Encounter: Payer: Self-pay | Admitting: Physician Assistant

## 2022-11-21 DIAGNOSIS — E039 Hypothyroidism, unspecified: Secondary | ICD-10-CM

## 2022-11-25 DIAGNOSIS — R102 Pelvic and perineal pain: Secondary | ICD-10-CM | POA: Diagnosis not present

## 2022-11-25 DIAGNOSIS — R109 Unspecified abdominal pain: Secondary | ICD-10-CM | POA: Diagnosis not present

## 2022-11-25 DIAGNOSIS — F17290 Nicotine dependence, other tobacco product, uncomplicated: Secondary | ICD-10-CM | POA: Diagnosis not present

## 2022-11-25 DIAGNOSIS — R300 Dysuria: Secondary | ICD-10-CM | POA: Diagnosis not present

## 2022-11-25 DIAGNOSIS — R1032 Left lower quadrant pain: Secondary | ICD-10-CM | POA: Diagnosis not present

## 2022-11-25 DIAGNOSIS — N390 Urinary tract infection, site not specified: Secondary | ICD-10-CM | POA: Diagnosis not present

## 2022-11-26 DIAGNOSIS — R102 Pelvic and perineal pain: Secondary | ICD-10-CM | POA: Diagnosis not present

## 2022-11-28 LAB — ALLERGENS W/TOTAL IGE AREA 2

## 2022-11-28 LAB — C3 AND C4
Complement C3, Serum: 130 mg/dL (ref 82–167)
Complement C4, Serum: 19 mg/dL (ref 12–38)

## 2022-11-28 LAB — CBC WITH DIFFERENTIAL/PLATELET
Basophils Absolute: 0.1 10*3/uL (ref 0.0–0.2)
Basos: 1 %
EOS (ABSOLUTE): 0.2 10*3/uL (ref 0.0–0.4)
Eos: 2 %
Hematocrit: 37.1 % (ref 34.0–46.6)
Hemoglobin: 12.8 g/dL (ref 11.1–15.9)
Immature Grans (Abs): 0 10*3/uL (ref 0.0–0.1)
Immature Granulocytes: 0 %
Lymphocytes Absolute: 2.2 10*3/uL (ref 0.7–3.1)
Lymphs: 32 %
MCH: 31.4 pg (ref 26.6–33.0)
MCHC: 34.5 g/dL (ref 31.5–35.7)
MCV: 91 fL (ref 79–97)
Monocytes Absolute: 0.4 10*3/uL (ref 0.1–0.9)
Monocytes: 5 %
Neutrophils Absolute: 4.1 10*3/uL (ref 1.4–7.0)
Neutrophils: 60 %
Platelets: 188 10*3/uL (ref 150–450)
RBC: 4.07 x10E6/uL (ref 3.77–5.28)
RDW: 12.4 % (ref 11.7–15.4)
WBC: 6.9 10*3/uL (ref 3.4–10.8)

## 2022-11-28 LAB — SEDIMENTATION RATE: Sed Rate: 9 mm/hr (ref 0–32)

## 2022-11-28 LAB — THYROID CASCADE PROFILE: TSH: 0.402 u[IU]/mL — ABNORMAL LOW (ref 0.450–4.500)

## 2022-11-28 LAB — COMPREHENSIVE METABOLIC PANEL
ALT: 16 IU/L (ref 0–32)
AST: 16 IU/L (ref 0–40)
Albumin/Globulin Ratio: 1.9 (ref 1.2–2.2)
Albumin: 4.1 g/dL (ref 4.0–5.0)
Alkaline Phosphatase: 54 IU/L (ref 42–106)
BUN/Creatinine Ratio: 17 (ref 9–23)
BUN: 14 mg/dL (ref 6–20)
Bilirubin Total: <0.2 mg/dL (ref 0.0–1.2)
CO2: 18 mmol/L — ABNORMAL LOW (ref 20–29)
Calcium: 9 mg/dL (ref 8.7–10.2)
Chloride: 107 mmol/L — ABNORMAL HIGH (ref 96–106)
Creatinine, Ser: 0.84 mg/dL (ref 0.57–1.00)
Globulin, Total: 2.2 g/dL (ref 1.5–4.5)
Glucose: 93 mg/dL (ref 70–99)
Potassium: 4.1 mmol/L (ref 3.5–5.2)
Sodium: 139 mmol/L (ref 134–144)
Total Protein: 6.3 g/dL (ref 6.0–8.5)
eGFR: 102 mL/min/{1.73_m2} (ref >59)

## 2022-11-28 LAB — ALPHA-GAL PANEL
Allergen Lamb IgE: <0.1 kU/L
Beef IgE: <0.1 kU/L
IgE (Immunoglobulin E), Serum: 44 IU/mL (ref 6–495)
O215-IgE Alpha-Gal: <0.1 kU/L
Pork IgE: <0.1 kU/L

## 2022-11-28 LAB — THYROXINE (T4) FREE, DIRECT: T4,Free (Direct): 1.35 ng/dL (ref 0.82–1.77)

## 2022-11-28 LAB — C-REACTIVE PROTEIN: CRP: <1 mg/L (ref 0–10)

## 2022-11-28 LAB — T3FREE: Triiodothyronine, Free, Serum: 3 pg/mL (ref 2.0–4.4)

## 2022-11-28 LAB — ANA W/REFLEX: Anti Nuclear Antibody (ANA): NEGATIVE

## 2022-11-28 LAB — TRYPTASE: Tryptase: 5.1 ug/L (ref 2.2–13.2)

## 2022-11-28 LAB — CHRONIC URTICARIA: cu index: <5.3 (ref <10)

## 2022-12-05 ENCOUNTER — Ambulatory Visit: Payer: BC Managed Care – PPO | Admitting: Physician Assistant

## 2022-12-31 DIAGNOSIS — F900 Attention-deficit hyperactivity disorder, predominantly inattentive type: Secondary | ICD-10-CM | POA: Diagnosis not present

## 2022-12-31 DIAGNOSIS — F331 Major depressive disorder, recurrent, moderate: Secondary | ICD-10-CM | POA: Diagnosis not present

## 2022-12-31 DIAGNOSIS — F411 Generalized anxiety disorder: Secondary | ICD-10-CM | POA: Diagnosis not present

## 2022-12-31 DIAGNOSIS — F410 Panic disorder [episodic paroxysmal anxiety] without agoraphobia: Secondary | ICD-10-CM | POA: Diagnosis not present

## 2023-01-23 ENCOUNTER — Ambulatory Visit (INDEPENDENT_AMBULATORY_CARE_PROVIDER_SITE_OTHER): Payer: BC Managed Care – PPO | Admitting: Student

## 2023-01-23 ENCOUNTER — Ambulatory Visit (HOSPITAL_BASED_OUTPATIENT_CLINIC_OR_DEPARTMENT_OTHER): Payer: BC Managed Care – PPO

## 2023-01-23 ENCOUNTER — Encounter: Payer: Self-pay | Admitting: Physician Assistant

## 2023-01-23 ENCOUNTER — Encounter (HOSPITAL_BASED_OUTPATIENT_CLINIC_OR_DEPARTMENT_OTHER): Payer: Self-pay | Admitting: Student

## 2023-01-23 ENCOUNTER — Ambulatory Visit (INDEPENDENT_AMBULATORY_CARE_PROVIDER_SITE_OTHER): Payer: BC Managed Care – PPO | Admitting: Physician Assistant

## 2023-01-23 VITALS — BP 98/66 | HR 79 | Temp 98.0°F | Resp 16 | Ht 68.0 in | Wt 229.6 lb

## 2023-01-23 DIAGNOSIS — Z3009 Encounter for other general counseling and advice on contraception: Secondary | ICD-10-CM | POA: Insufficient documentation

## 2023-01-23 DIAGNOSIS — M79641 Pain in right hand: Secondary | ICD-10-CM

## 2023-01-23 DIAGNOSIS — Z13220 Encounter for screening for lipoid disorders: Secondary | ICD-10-CM

## 2023-01-23 DIAGNOSIS — L910 Hypertrophic scar: Secondary | ICD-10-CM | POA: Diagnosis not present

## 2023-01-23 DIAGNOSIS — Z0000 Encounter for general adult medical examination without abnormal findings: Secondary | ICD-10-CM | POA: Insufficient documentation

## 2023-01-23 DIAGNOSIS — R7989 Other specified abnormal findings of blood chemistry: Secondary | ICD-10-CM

## 2023-01-23 DIAGNOSIS — Z0001 Encounter for general adult medical examination with abnormal findings: Secondary | ICD-10-CM

## 2023-01-23 LAB — LIPID PANEL
Cholesterol: 207 mg/dL — ABNORMAL HIGH (ref 0–200)
HDL: 50.8 mg/dL (ref >39.00)
LDL Cholesterol: 147 mg/dL — ABNORMAL HIGH (ref 0–99)
NonHDL: 156.4
Total CHOL/HDL Ratio: 4
Triglycerides: 48 mg/dL (ref 0.0–149.0)
VLDL: 9.6 mg/dL (ref 0.0–40.0)

## 2023-01-23 MED ORDER — LEVONORGEST-ETH ESTRAD 91-DAY 0.15-0.03 &0.01 MG PO TABS: 1.0000 | ORAL_TABLET | Freq: Every day | ORAL | 3 refills | Status: DC

## 2023-01-23 NOTE — Assessment & Plan Note (Signed)
Options discussed. Plan to stop Loestrin Fe this summer.  Will start on Amethia as directed. Pt aware of risks vs benefits and possible adverse reactions. She will let me know how things are going.

## 2023-01-23 NOTE — Patient Instructions (Signed)
Recheck labs today  Keep up good work with health goals!   Referral to dermatology to discuss keloid treatment options.  Over the summer, plan to start on new OCP as discussed, start the Sunday after your period. Use back-up methods for the first  few months. Call if any changes or concerns.

## 2023-01-23 NOTE — Progress Notes (Signed)
Subjective:    Patient ID: Morgan Marshall, female    DOB: 12/26/2000, 22 y.o.   MRN: [id number]  Chief Complaint  Patient presents with   Annual Exam    Fasting    Contraception    Wanting to switch current [facility]    HPI Patient is in today for annual exam. Here with mom today.   Currently seeing psychiatry - Prozac was discontinued; stated on Wellbutrin and klonopin recently. Still seeing counseling.   Acute concerns: Birth control - 6 years ago - started on Junel FE Heavy periods, cramping, acne, mood swings, still on track every 28 days   Health maintenance: Lifestyle/ exercise: Work is exercise, working at [employer] Nutrition: Doing well Mental health: Working with psych  Sleep: Intermittently restless  Substance use: Vaping daily - nicotine  ETOH: None  Sexual activity: Yes, active, monogamous, not concerned  Immunizations: UTD  Skin: Doing well, no moles or concerns    Past Medical History:  Diagnosis Date   ADD (attention deficit disorder)    Asthma    Chicken pox    GERD (gastroesophageal reflux disease)    Jaundice    Urinary tract infection     Past Surgical History:  Procedure Laterality Date   CHOLECYSTECTOMY N/A 05/10/2022   Procedure: LAPAROSCOPIC CHOLECYSTECTOMY WITH INTRAOPERATIVE CHOLANGIOGRAM;  Surgeon: Karie Soda, MD;  Location: [facility];  Service: General;  Laterality: N/A;   ESOPHAGOGASTRODUODENOSCOPY N/A 05/07/2022   Procedure: ESOPHAGOGASTRODUODENOSCOPY (EGD);  Surgeon: Charna Elizabeth, MD;  Location: [facility];  Service: Gastroenterology;  Laterality: N/A;  epigastric pain, nausea and vomiting   TYMPANOSTOMY TUBE PLACEMENT      Family History  Problem Relation Age of Onset   Heart attack Mother    Hyperlipidemia Father    Asthma Brother    Arthritis Maternal Grandmother    Asthma Maternal Grandmother    Hyperlipidemia Maternal Grandmother    Hyperlipidemia Maternal Grandfather    Hyperlipidemia Paternal Grandmother    Early death  Paternal Grandfather     Social History   Tobacco Use   Smoking status: Never   Smokeless tobacco: Never  Vaping Use   Vaping Use: Never used  Substance Use Topics   Alcohol use: Not Currently   Drug use: Not Currently     Allergies  Allergen Reactions   Latex Hives    Review of Systems NEGATIVE UNLESS OTHERWISE INDICATED IN HPI      Objective:     BP 98/66   Pulse 79   Temp 98 F (36.7 C) (Temporal)   Resp 16   Ht 5\' 8"  (1.727 m)   Wt 229 lb 9.6 oz (104.1 kg)   SpO2 99%   BMI 34.91 kg/m   Wt Readings from Last 3 Encounters:  01/23/23 229 lb 9.6 oz (104.1 kg)  11/17/22 233 lb 8 oz (105.9 kg)  08/13/22 230 lb (104.3 kg) (99 %, Z= 2.32)*   * Growth percentiles are based on CDC (Girls, 2-20 Years) data.    BP Readings from Last 3 Encounters:  01/23/23 98/66  11/17/22 128/72  05/10/22 (!) 145/89     Physical Exam Vitals and nursing note reviewed.  Constitutional:      Appearance: Normal appearance. She is obese. She is not toxic-appearing.  HENT:     Head: Normocephalic and atraumatic.     Right Ear: Tympanic membrane, ear canal and external ear normal.     Left Ear: Tympanic membrane and ear canal normal.  Ears:     Comments: Keloid posterior L auricle     Nose: Nose normal.     Mouth/Throat:     Mouth: Mucous membranes are moist.  Eyes:     Extraocular Movements: Extraocular movements intact.     Conjunctiva/sclera: Conjunctivae normal.     Pupils: Pupils are equal, round, and reactive to light.  Cardiovascular:     Rate and Rhythm: Normal rate and regular rhythm.     Pulses: Normal pulses.     Heart sounds: Normal heart sounds.  Pulmonary:     Effort: Pulmonary effort is normal.     Breath sounds: Normal breath sounds.  Abdominal:     General: Abdomen is flat. Bowel sounds are normal.     Palpations: Abdomen is soft.  Musculoskeletal:        General: Normal range of motion.     Cervical back: Normal range of motion and neck supple.   Skin:    General: Skin is warm and dry.     Findings: No lesion or rash.  Neurological:     General: No focal deficit present.     Mental Status: She is alert and oriented to person, place, and time.  Psychiatric:        Mood and Affect: Mood normal.        Behavior: Behavior normal.        Thought Content: Thought content normal.        Judgment: Judgment normal.        Assessment & Plan:  Encounter for annual physical exam  Low TSH level -     Thyroid Panel With TSH  Screening for cholesterol level -     Lipid panel  Keloid of skin -     Ambulatory referral to Dermatology  Counseling for birth control, oral contraceptives Assessment & Plan: Options discussed. Plan to stop Loestrin Fe this summer.  Will start on Amethia as directed. Pt aware of risks vs benefits and possible adverse reactions. She will let me know how things are going.    Orders: -     Levonorgest-Eth Estrad 91-Day; Take 1 tablet by mouth daily.  Dispense: 91 tablet; Refill: 3   Age-appropriate screening and counseling performed today. Will check labs and call with results. Preventive measures discussed and printed in AVS for patient.   Patient Counseling: [x]   Nutrition: Stressed importance of moderation in sodium/caffeine intake, saturated fat and cholesterol, caloric balance, sufficient intake of fresh fruits, vegetables, and fiber.  [x]   Stressed the importance of regular exercise.   [x]   Substance Abuse: Discussed cessation/primary prevention of tobacco, alcohol, or other drug use; driving or other dangerous activities under the influence; availability of treatment for abuse.   []   Injury prevention: Discussed safety belts, safety helmets, smoke detector, smoking near bedding or upholstery.   [x]   Sexuality: Discussed sexually transmitted diseases, partner selection, use of condoms, avoidance of unintended pregnancy  and contraceptive alternatives.   [x]   Dental health: Discussed importance of  regular tooth brushing, flossing, and dental visits.  [x]   Health maintenance and immunizations reviewed. Please refer to Health maintenance section.        Return in about 1 year (around 01/23/2024) for physical.      Wileen Duncanson M Jujhar Everett, PA-C

## 2023-01-23 NOTE — Progress Notes (Signed)
Chief Complaint: Right hand pain     History of Present Illness:    Morgan Marshall is a 22 y.o. female presenting for evaluation of pain in her right hand.  She works at a [employer] and she reports that yesterday she was going to take a dog out of the crate when it bit onto her right hand and would not let go.  She tried to shake the dog off which she says felt like it made it worse.  Since then she has had moderate amounts of pain in the right hand that radiates up through the wrist and into the forearm.  She is having trouble being able to make a fist.  She does have one tooth mark on her hand however nothing penetrated through the skin.  Has tried taking Advil as well as taping her fingers and using a wrist brace.   Surgical History:   None  PMH/PSH/Family History/Social History/Meds/Allergies:    Past Medical History:  Diagnosis Date   ADD (attention deficit disorder)    Asthma    Chicken pox    GERD (gastroesophageal reflux disease)    Jaundice    Urinary tract infection    Past Surgical History:  Procedure Laterality Date   CHOLECYSTECTOMY N/A 05/10/2022   Procedure: LAPAROSCOPIC CHOLECYSTECTOMY WITH INTRAOPERATIVE CHOLANGIOGRAM;  Surgeon: Karie Soda, MD;  Location: [facility];  Service: General;  Laterality: N/A;   ESOPHAGOGASTRODUODENOSCOPY N/A 05/07/2022   Procedure: ESOPHAGOGASTRODUODENOSCOPY (EGD);  Surgeon: Charna Elizabeth, MD;  Location: [facility];  Service: Gastroenterology;  Laterality: N/A;  epigastric pain, nausea and vomiting   TYMPANOSTOMY TUBE PLACEMENT     Social History   Socioeconomic History   Marital status: Single    Spouse name: Not on file   Number of children: Not on file   Years of education: Not on file   Highest education level: 12th grade  Occupational History   Not on file  Tobacco Use   Smoking status: Never   Smokeless tobacco: Never  Vaping Use   Vaping Use: Never used  Substance and Sexual  Activity   Alcohol use: Not Currently   Drug use: Not Currently   Sexual activity: Not on file  Other Topics Concern   Not on file  Social History Narrative   Not on file   Social Determinants of Health   Financial Resource Strain: Not on file  Food Insecurity: No Food Insecurity (12/03/2022)   Hunger Vital Sign    Worried About Running Out of Food in the Last Year: Never true    Ran Out of Food in the Last Year: Never true  Transportation Needs: No Transportation Needs (12/03/2022)   PRAPARE - Administrator, Civil Service (Medical): No    Lack of Transportation (Non-Medical): No  Physical Activity: Unknown (12/03/2022)   Exercise Vital Sign    Days of Exercise per Week: 3 days    Minutes of Exercise per Session: Not on file  Stress: Stress Concern Present (12/03/2022)   [employer] of Occupational Health - Occupational Stress Questionnaire    Feeling of Stress : Very much  Social Connections: Unknown (12/03/2022)   Social Connection and Isolation Panel [NHANES]    Frequency of Communication with Friends and Family: More than three times a week    Frequency of  Social Gatherings with Friends and Family: Once a week    Attends Religious Services: Never    Database administrator or Organizations: No    Attends Engineer, structural: Not on file    Marital Status: Patient declined   Family History  Problem Relation Age of Onset   Heart attack Mother    Hyperlipidemia Father    Asthma Brother    Arthritis Maternal Grandmother    Asthma Maternal Grandmother    Hyperlipidemia Maternal Grandmother    Hyperlipidemia Maternal Grandfather    Hyperlipidemia Paternal Grandmother    Early death Paternal Grandfather    Allergies  Allergen Reactions   Latex Hives   Current Outpatient Medications  Medication Sig Dispense Refill   albuterol (VENTOLIN HFA) 108 (90 Base) MCG/ACT inhaler INHALE 1-2 PUFFS INTO THE LUNGS EVERY 6 HOURS AS NEEDED. (Patient taking  differently: Inhale 1-2 puffs into the lungs every 6 (six) hours as needed for wheezing or shortness of breath.) 18 each 0   buPROPion (WELLBUTRIN XL) 150 MG 24 hr tablet Take 150 mg by mouth daily.     clonazePAM (KLONOPIN) 0.5 MG tablet Take 0.5 mg by mouth 3 (three) times daily as needed for anxiety.     Levonorgestrel-Ethinyl Estradiol (AMETHIA) 0.15-0.03 &0.01 MG tablet Take 1 tablet by mouth daily. 91 tablet 3   norethindrone-ethinyl estradiol-iron (LOESTRIN FE) 1.5-30 MG-MCG tablet Take 1 tablet by mouth daily.     Probiotic Product (PROBIOTIC DAILY PO) Take by mouth.     No current facility-administered medications for this visit.   No results found.  Review of Systems:   A ROS was performed including pertinent positives and negatives as documented in the HPI.  Physical Exam :   Constitutional: NAD and appears stated age Neurological: Alert and oriented Psych: Appropriate affect and cooperative There were no vitals taken for this visit.   Comprehensive Musculoskeletal Exam:    Skin is intact without laceration or puncture.  Palpation over right third fourth and fifth metacarpals as well as the ulnar-sided wrist.  There is some soft tissue edema noted throughout this area.  No erythema, ecchymosis, or obvious deformity.  Active wrist range of motion to 90 degrees flexion and 40 degrees extension without eliciting significant pain.  Radial pulse 2+.  Distal sensation equal and intact.    Imaging:   Xray (Right hand 4 views): Negative for fracture or dislocation   I personally reviewed and interpreted the radiographs.   Assessment:   22 y.o. female with right hand/wrist pain after a dog bite yesterday.  This did not break the skin however in trying to shake her hand free I suspect that she could have sustained a ligament sprain or caused tendon inflammation.  X-rays appear negative for fractures.  At this point and suggests continuing with symptomatic treatment including buddy  taping of the fingers and wrist bracing.  Also encouraged use of anti-inflammatories.  We discussed that these injuries can take a few weeks to get better however if symptoms significantly worsen or are persistent after at least 4 weeks, then we can follow-up for reevaluation.  Plan :    -Return to clinic as needed     I personally saw and evaluated the patient, and participated in the management and treatment plan.  Hazle Nordmann, PA-C Orthopedics  This document was dictated using Conservation officer, historic buildings. A reasonable attempt at proof reading has been made to minimize errors.

## 2023-01-24 LAB — THYROID PANEL WITH TSH
Free Thyroxine Index: 2.8 (ref 1.4–3.8)
T3 Uptake: 28 % (ref 22–35)
T4, Total: 10 ug/dL (ref 5.3–11.7)
TSH: 0.53 mIU/L

## 2023-01-26 ENCOUNTER — Other Ambulatory Visit: Payer: Self-pay

## 2023-01-26 DIAGNOSIS — E039 Hypothyroidism, unspecified: Secondary | ICD-10-CM

## 2023-02-14 ENCOUNTER — Other Ambulatory Visit: Payer: Self-pay | Admitting: Physician Assistant

## 2023-02-14 DIAGNOSIS — Z3009 Encounter for other general counseling and advice on contraception: Secondary | ICD-10-CM

## 2023-02-16 NOTE — Telephone Encounter (Signed)
Previously filled by historical provider please advise 

## 2023-02-21 ENCOUNTER — Other Ambulatory Visit: Payer: Self-pay | Admitting: Physician Assistant

## 2023-02-23 ENCOUNTER — Other Ambulatory Visit: Payer: Self-pay

## 2023-02-25 DIAGNOSIS — F331 Major depressive disorder, recurrent, moderate: Secondary | ICD-10-CM | POA: Diagnosis not present

## 2023-02-25 DIAGNOSIS — F410 Panic disorder [episodic paroxysmal anxiety] without agoraphobia: Secondary | ICD-10-CM | POA: Diagnosis not present

## 2023-02-25 DIAGNOSIS — F4312 Post-traumatic stress disorder, chronic: Secondary | ICD-10-CM | POA: Diagnosis not present

## 2023-02-25 DIAGNOSIS — F411 Generalized anxiety disorder: Secondary | ICD-10-CM | POA: Diagnosis not present

## 2023-03-02 ENCOUNTER — Telehealth (INDEPENDENT_AMBULATORY_CARE_PROVIDER_SITE_OTHER): Payer: BC Managed Care – PPO | Admitting: Physician Assistant

## 2023-03-02 VITALS — Ht 68.0 in | Wt 229.0 lb

## 2023-03-02 DIAGNOSIS — J069 Acute upper respiratory infection, unspecified: Secondary | ICD-10-CM

## 2023-03-02 MED ORDER — ALBUTEROL SULFATE HFA 108 (90 BASE) MCG/ACT IN AERS: INHALATION_SPRAY | RESPIRATORY_TRACT | 0 refills | Status: DC

## 2023-03-02 MED ORDER — PREDNISONE 20 MG PO TABS: 20.0000 mg | ORAL_TABLET | Freq: Two times a day (BID) | ORAL | 0 refills | Status: AC

## 2023-03-02 MED ORDER — BENZONATATE 100 MG PO CAPS: 100.0000 mg | ORAL_CAPSULE | Freq: Three times a day (TID) | ORAL | 0 refills | Status: DC | PRN

## 2023-03-02 NOTE — Patient Instructions (Signed)
You have a viral upper respiratory infection. This type of infection does not require antibiotics. Symptoms should improve over the next 7 to 10 days.   Some things that can make you feel better are: - Increased rest - Increasing fluid with water/sugar free electrolytes - Acetaminophen and ibuprofen as needed for fever/pain - Salt water gargling, chloraseptic spray and throat lozenges for sore throat - OTC guaifenesin (Mucinex) 600 mg twice daily for congestion - Saline sinus flushes or a neti pot - Humidifying the air

## 2023-03-02 NOTE — Progress Notes (Signed)
   Virtual Visit via Video Note  I connected with  Morgan Marshall  on 03/02/23 at  8:30 AM EDT by a video enabled telemedicine application and verified that I am speaking with the correct person using two identifiers.  Location: Patient: home Provider: Nature conservation officer at [employer] Persons present: Patient and myself   I discussed the limitations of evaluation and management by telemedicine and the availability of in person appointments. The patient expressed understanding and agreed to proceed.   History of Present Illness:  Chief complaint: Acute sick symptoms Symptom onset: Yesterday, 03/01/23 Pertinent positives: Nasal congestion, bronchial cough Pertinent negatives: Fever, chills, SOB / wheezing, chest pain  Treatments tried: Mucinex, Dayquil  Sick exposure: Best friend's children - told they had common cold    Observations/Objective:   Gen: Awake, alert, no acute distress, congested sounding Resp: Breathing is even and non-labored Psych: calm/pleasant demeanor Neuro: Alert and Oriented x 3, + facial symmetry, speech is clear.   Assessment and Plan:  Acute upper respiratory infection  Likely viral URI Fluids, rest, no antibiotics required at this time Refilled her albuterol inhaler to use as needed Prednisone 20 mg and tessalon perles for added relief Recheck prn.    Follow Up Instructions:    I discussed the assessment and treatment plan with the patient. The patient was provided an opportunity to ask questions and all were answered. The patient agreed with the plan and demonstrated an understanding of the instructions.   The patient was advised to call back or seek an in-person evaluation if the symptoms worsen or if the condition fails to improve as anticipated.  Rohaan Durnil M Velta Rockholt, PA-C

## 2023-03-04 ENCOUNTER — Other Ambulatory Visit: Payer: Self-pay | Admitting: Physician Assistant

## 2023-03-04 ENCOUNTER — Encounter: Payer: Self-pay | Admitting: Physician Assistant

## 2023-03-04 ENCOUNTER — Other Ambulatory Visit: Payer: Self-pay

## 2023-03-04 NOTE — Telephone Encounter (Signed)
Please see pt msg and advise if she should schedule a VV

## 2023-03-05 ENCOUNTER — Other Ambulatory Visit: Payer: Self-pay

## 2023-03-05 MED ORDER — AZITHROMYCIN 250 MG PO TABS: ORAL_TABLET | ORAL | 0 refills | Status: AC

## 2023-05-06 ENCOUNTER — Encounter: Payer: Self-pay | Admitting: Physician Assistant

## 2023-05-14 ENCOUNTER — Telehealth (INDEPENDENT_AMBULATORY_CARE_PROVIDER_SITE_OTHER): Payer: BC Managed Care – PPO | Admitting: Physician Assistant

## 2023-05-14 DIAGNOSIS — Z3009 Encounter for other general counseling and advice on contraception: Secondary | ICD-10-CM

## 2023-05-14 MED ORDER — NORETHIN ACE-ETH ESTRAD-FE 1.5-30 MG-MCG PO TABS
1.0000 | ORAL_TABLET | Freq: Every day | ORAL | 3 refills | Status: DC
Start: 2023-05-14 — End: 2024-04-04

## 2023-05-14 NOTE — Assessment & Plan Note (Signed)
No higher dose available for Morgan Marshall Plan to start on Aurovela Fe, which should be the equivalent of her previous birth control Junel Fe.  She is going to start with birth control this coming Sunday.  She will let me know if any issues with the pharmacy finding this or any problems with insurance coverage.  She knows not to miss pills. Pt aware of risks vs benefits and possible adverse reactions.  Understands that this does not protect against STIs.  Plan to follow-up at physical next year or as needed.

## 2023-05-14 NOTE — Progress Notes (Signed)
   Virtual Visit via Video Note  I connected with  Morgan Marshall  on 05/14/23 at  9:00 AM EDT by a video enabled telemedicine application and verified that I am speaking with the correct person using two identifiers.  Location: Patient: home Provider: Nature conservation officer at [employer] Persons present: Patient and myself   I discussed the limitations of evaluation and management by telemedicine and the availability of in person appointments. The patient expressed understanding and agreed to proceed.   History of Present Illness:  22 year old female presenting for virtual visit to discuss concerns with her new birth control.  We had changed her to Amethia in May of this year. Has been taking Amethia - says the last 4 weeks of the 90 day pill she has been having a lot of breakthrough bleeding; still currently bleeding - supposed to have placebo week next week.  She is looking to start a different birth control or just try to increase the dose on the current birth control.  States that she was previously on Junel Fe since about seventh grade and did very well with this birth control.  It was unfortunately discontinued or her insurance did not cover it anymore and somehow she was switched to a different 1 called microestrin, which was not helping her either.  She does not smoke.  She does not have a history of blood clots.  She is currently working full-time and into school full-time.  She has no other concerns today.   Observations/Objective:   Gen: Awake, alert, no acute distress Resp: Breathing is even and non-labored Psych: calm/pleasant demeanor Neuro: Alert and Oriented x 3, + facial symmetry, speech is clear.   Assessment and Plan:  Problem List Items Addressed This Visit       Other   Counseling for birth control, oral contraceptives - Primary    No higher dose available for Amethia Plan to start on Aurovela Fe, which should be the equivalent of her previous birth  control Junel Fe.  She is going to start with birth control this coming Sunday.  She will let me know if any issues with the pharmacy finding this or any problems with insurance coverage.  She knows not to miss pills. Pt aware of risks vs benefits and possible adverse reactions.  Understands that this does not protect against STIs.  Plan to follow-up at physical next year or as needed.      Relevant Medications   norethindrone-ethinyl estradiol-iron (AUROVELA FE 1.5/30) 1.5-30 MG-MCG tablet     Follow Up Instructions:    I discussed the assessment and treatment plan with the patient. The patient was provided an opportunity to ask questions and all were answered. The patient agreed with the plan and demonstrated an understanding of the instructions.   The patient was advised to call back or seek an in-person evaluation if the symptoms worsen or if the condition fails to improve as anticipated.  Cason Dabney M Traniya Prichett, PA-C

## 2023-05-19 DIAGNOSIS — R0981 Nasal congestion: Secondary | ICD-10-CM | POA: Diagnosis not present

## 2023-05-19 DIAGNOSIS — J029 Acute pharyngitis, unspecified: Secondary | ICD-10-CM | POA: Diagnosis not present

## 2023-05-19 DIAGNOSIS — R52 Pain, unspecified: Secondary | ICD-10-CM | POA: Diagnosis not present

## 2023-05-19 DIAGNOSIS — R051 Acute cough: Secondary | ICD-10-CM | POA: Diagnosis not present

## 2023-05-22 ENCOUNTER — Telehealth: Payer: BC Managed Care – PPO | Admitting: Physician Assistant

## 2023-05-22 DIAGNOSIS — J40 Bronchitis, not specified as acute or chronic: Secondary | ICD-10-CM | POA: Diagnosis not present

## 2023-05-22 DIAGNOSIS — J019 Acute sinusitis, unspecified: Secondary | ICD-10-CM | POA: Diagnosis not present

## 2023-05-22 DIAGNOSIS — B9689 Other specified bacterial agents as the cause of diseases classified elsewhere: Secondary | ICD-10-CM

## 2023-05-22 MED ORDER — PREDNISONE 10 MG (21) PO TBPK: ORAL_TABLET | ORAL | 0 refills | Status: DC

## 2023-05-22 MED ORDER — AMOXICILLIN-POT CLAVULANATE 875-125 MG PO TABS
1.0000 | ORAL_TABLET | Freq: Two times a day (BID) | ORAL | 0 refills | Status: DC
Start: 2023-05-22 — End: 2023-11-20

## 2023-05-22 MED ORDER — PROMETHAZINE-DM 6.25-15 MG/5ML PO SYRP: 5.0000 mL | ORAL_SOLUTION | Freq: Four times a day (QID) | ORAL | 0 refills | Status: DC | PRN

## 2023-05-22 NOTE — Patient Instructions (Signed)
Morgan Marshall, thank you for joining [name], PA-C for today's virtual visit.  While this provider is not your primary care provider (PCP), if your PCP is located in our provider database this encounter information will be shared with them immediately following your visit.   A [facility] MyChart account gives you access to today's visit and all your visits, tests, and labs performed at [facility] " click here if you don't have a [facility] MyChart account or go to [email or web address]  Consent: (Patient) Morgan Marshall provided verbal consent for this virtual visit at the beginning of the encounter.  Current Medications:  Current Outpatient Medications:    amoxicillin-clavulanate (AUGMENTIN) 875-125 MG tablet, Take 1 tablet by mouth 2 (two) times daily., Disp: 20 tablet, Rfl: 0   predniSONE (STERAPRED UNI-PAK 21 TAB) 10 MG (21) TBPK tablet, 6 day taper; take as directed on package instructions, Disp: 21 tablet, Rfl: 0   promethazine-dextromethorphan (PROMETHAZINE-DM) 6.25-15 MG/5ML syrup, Take 5 mLs by mouth 4 (four) times daily as needed., Disp: 118 mL, Rfl: 0   albuterol (VENTOLIN HFA) 108 (90 Base) MCG/ACT inhaler, INHALE 1-2 PUFFS INTO THE LUNGS EVERY 6 HOURS AS NEEDED., Disp: 18 each, Rfl: 0   benzonatate (TESSALON PERLES) 100 MG capsule, Take 1 capsule (100 mg total) by mouth 3 (three) times daily as needed for cough., Disp: 30 capsule, Rfl: 0   buPROPion (WELLBUTRIN XL) 150 MG 24 hr tablet, Take 150 mg by mouth daily., Disp: , Rfl:    clonazePAM (KLONOPIN) 0.5 MG tablet, Take 0.5 mg by mouth 3 (three) times daily as needed for anxiety., Disp: , Rfl:    norethindrone-ethinyl estradiol-iron (AUROVELA FE 1.5/30) 1.5-30 MG-MCG tablet, Take 1 tablet by mouth daily., Disp: 90 tablet, Rfl: 3   Probiotic Product (PROBIOTIC DAILY PO), Take by mouth., Disp: , Rfl:    Medications ordered in this encounter:  Meds ordered this encounter  Medications    amoxicillin-clavulanate (AUGMENTIN) 875-125 MG tablet    Sig: Take 1 tablet by mouth 2 (two) times daily.    Dispense:  20 tablet    Refill:  0    Order Specific Question:   Supervising Provider    Answer:   Merrilee Jansky [[id number]]   predniSONE (STERAPRED UNI-PAK 21 TAB) 10 MG (21) TBPK tablet    Sig: 6 day taper; take as directed on package instructions    Dispense:  21 tablet    Refill:  0    Order Specific Question:   Supervising Provider    Answer:   Merrilee Jansky [[id number]]   promethazine-dextromethorphan (PROMETHAZINE-DM) 6.25-15 MG/5ML syrup    Sig: Take 5 mLs by mouth 4 (four) times daily as needed.    Dispense:  118 mL    Refill:  0    Order Specific Question:   Supervising Provider    Answer:   Merrilee Jansky [[id number]]     *If you need refills on other medications prior to your next appointment, please contact your pharmacy*  Follow-Up: Call back or seek an in-person evaluation if the symptoms worsen or if the condition fails to improve as anticipated.  [facility] [phone]  Other Instructions Sinus Infection, Adult A sinus infection, also called sinusitis, is inflammation of your sinuses. Sinuses are hollow spaces in the bones around your face. Your sinuses are located: Around your eyes. In the middle of your forehead. Behind your nose. In your cheekbones. Mucus normally drains out of your sinuses. When  your nasal tissues become inflamed or swollen, mucus can become trapped or blocked. This allows bacteria, viruses, and fungi to grow, which leads to infection. Most infections of the sinuses are caused by a virus. A sinus infection can develop quickly. It can last for up to 4 weeks (acute) or for more than 12 weeks (chronic). A sinus infection often develops after a cold. What are the causes? This condition is caused by anything that creates swelling in the sinuses or stops mucus from draining. This includes: Allergies. Asthma. Infection  from bacteria or viruses. Deformities or blockages in your nose or sinuses. Abnormal growths in the nose (nasal polyps). Pollutants, such as chemicals or irritants in the air. Infection from fungi. This is rare. What increases the risk? You are more likely to develop this condition if you: Have a weak body defense system (immune system). Do a lot of swimming or diving. Overuse nasal sprays. Smoke. What are the signs or symptoms? The main symptoms of this condition are pain and a feeling of pressure around the affected sinuses. Other symptoms include: Stuffy nose or congestion that makes it difficult to breathe through your nose. Thick yellow or greenish drainage from your nose. Tenderness, swelling, and warmth over the affected sinuses. A cough that may get worse at night. Decreased sense of smell and taste. Extra mucus that collects in the throat or the back of the nose (postnasal drip) causing a sore throat or bad breath. Tiredness (fatigue). Fever. How is this diagnosed? This condition is diagnosed based on: Your symptoms. Your medical history. A physical exam. Tests to find out if your condition is acute or chronic. This may include: Checking your nose for nasal polyps. Viewing your sinuses using a device that has a light (endoscope). Testing for allergies or bacteria. Imaging tests, such as an MRI or CT scan. In rare cases, a bone biopsy may be done to rule out more serious types of fungal sinus disease. How is this treated? Treatment for a sinus infection depends on the cause and whether your condition is chronic or acute. If caused by a virus, your symptoms should go away on their own within 10 days. You may be given medicines to relieve symptoms. They include: Medicines that shrink swollen nasal passages (decongestants). A spray that eases inflammation of the nostrils (topical intranasal corticosteroids). Rinses that help get rid of thick mucus in your nose (nasal saline  washes). Medicines that treat allergies (antihistamines). Over-the-counter pain relievers. If caused by bacteria, your health care provider may recommend waiting to see if your symptoms improve. Most bacterial infections will get better without antibiotic medicine. You may be given antibiotics if you have: A severe infection. A weak immune system. If caused by narrow nasal passages or nasal polyps, surgery may be needed. Follow these instructions at home: Medicines Take, use, or apply over-the-counter and prescription medicines only as told by your health care provider. These may include nasal sprays. If you were prescribed an antibiotic medicine, take it as told by your health care provider. Do not stop taking the antibiotic even if you start to feel better. Hydrate and humidify  Drink enough fluid to keep your urine pale yellow. Staying hydrated will help to thin your mucus. Use a cool mist humidifier to keep the humidity level in your home above 50%. Inhale steam for 10-15 minutes, 3-4 times a day, or as told by your health care provider. You can do this in the bathroom while a hot shower is running. Limit  your exposure to cool or dry air. Rest Rest as much as possible. Sleep with your head raised (elevated). Make sure you get enough sleep each night. General instructions  Apply a warm, moist washcloth to your face 3-4 times a day or as told by your health care provider. This will help with discomfort. Use nasal saline washes as often as told by your health care provider. Wash your hands often with soap and water to reduce your exposure to germs. If soap and water are not available, use hand sanitizer. Do not smoke. Avoid being around people who are smoking (secondhand smoke). Keep all follow-up visits. This is important. Contact a health care provider if: You have a fever. Your symptoms get worse. Your symptoms do not improve within 10 days. Get help right away if: You have a  severe headache. You have persistent vomiting. You have severe pain or swelling around your face or eyes. You have vision problems. You develop confusion. Your neck is stiff. You have trouble breathing. These symptoms may be an emergency. Get help right away. Call 911. Do not wait to see if the symptoms will go away. Do not drive yourself to the hospital. Summary A sinus infection is soreness and inflammation of your sinuses. Sinuses are hollow spaces in the bones around your face. This condition is caused by nasal tissues that become inflamed or swollen. The swelling traps or blocks the flow of mucus. This allows bacteria, viruses, and fungi to grow, which leads to infection. If you were prescribed an antibiotic medicine, take it as told by your health care provider. Do not stop taking the antibiotic even if you start to feel better. Keep all follow-up visits. This is important. This information is not intended to replace advice given to you by your health care provider. Make sure you discuss any questions you have with your health care provider. Document Revised: 08/06/2021 Document Reviewed: 08/06/2021 Elsevier Patient Education  2024 Elsevier Inc.   Acute Bronchitis, Adult  Acute bronchitis is when air tubes in the lungs (bronchi) suddenly get swollen. The condition can make it hard for you to breathe. In adults, acute bronchitis usually goes away within 2 weeks. A cough caused by bronchitis may last up to 3 weeks. Smoking, allergies, and asthma can make the condition worse. What are the causes? Germs that cause cold and flu (viruses). The most common cause of this condition is the virus that causes the common cold. Bacteria. Substances that bother (irritate) the lungs, including: Smoke from cigarettes and other types of tobacco. Dust and pollen. Fumes from chemicals, gases, or burned fuel. Indoor or outdoor air pollution. What increases the risk? A weak body's defense system.  This is also called the immune system. Any condition that affects your lungs and breathing, such as asthma. What are the signs or symptoms? A cough. Coughing up clear, yellow, or green mucus. Making high-pitched whistling sounds when you breathe, most often when you breathe out (wheezing). Runny or stuffy nose. Having too much mucus in your lungs (chest congestion). Shortness of breath. Body aches. A sore throat. How is this treated? Acute bronchitis may go away over time without treatment. Your doctor may tell you to: Drink more fluids. This will help thin your mucus so it is easier to cough up. Use a device that gets medicine into your lungs (inhaler). Use a vaporizer or a humidifier. These are machines that add water to the air. This helps with coughing and poor breathing. Take a medicine  that thins mucus and helps clear it from your lungs. Take a medicine that prevents or stops coughing. It is not common to take an antibiotic medicine for this condition. Follow these instructions at home:  Take over-the-counter and prescription medicines only as told by your doctor. Use an inhaler, vaporizer, or humidifier as told by your doctor. Take two teaspoons (10 mL) of honey at bedtime. This helps lessen your coughing at night. Drink enough fluid to keep your pee (urine) pale yellow. Do not smoke or use any products that contain nicotine or tobacco. If you need help quitting, ask your doctor. Get a lot of rest. Return to your normal activities when your doctor says that it is safe. Keep all follow-up visits. How is this prevented?  Wash your hands often with soap and water for at least 20 seconds. If you cannot use soap and water, use hand sanitizer. Avoid contact with people who have cold symptoms. Try not to touch your mouth, nose, or eyes with your hands. Avoid breathing in smoke or chemical fumes. Make sure to get the flu shot every year. Contact a doctor if: Your symptoms do not  get better in 2 weeks. You have trouble coughing up the mucus. Your cough keeps you awake at night. You have a fever. Get help right away if: You cough up blood. You have chest pain. You have very bad shortness of breath. You faint or keep feeling like you are going to faint. You have a very bad headache. Your fever or chills get worse. These symptoms may be an emergency. Get help right away. Call your local emergency services (911 in the [place] Do not wait to see if the symptoms will go away. Do not drive yourself to the hospital. Summary Acute bronchitis is when air tubes in the lungs (bronchi) suddenly get swollen. In adults, acute bronchitis usually goes away within 2 weeks. Drink more fluids. This will help thin your mucus so it is easier to cough up. Take over-the-counter and prescription medicines only as told by your doctor. Contact a doctor if your symptoms do not improve after 2 weeks of treatment. This information is not intended to replace advice given to you by your health care provider. Make sure you discuss any questions you have with your health care provider. Document Revised: 01/02/2021 Document Reviewed: 01/02/2021 Elsevier Patient Education  2024 Elsevier Inc.    If you have been instructed to have an in-person evaluation today at a local Urgent Care facility, please use the link below. It will take you to a list of all of our available [facility], including address, phone number and hours of operation. Please do not delay care.  [facility]  If you or a family member do not have a primary care provider, use the link below to schedule a visit and establish care. When you choose a [facility] primary care physician or advanced practice provider, you gain a long-term partner in health. Find a Primary Care Provider  Learn more about [facility] in-office and virtual care options:  - Get Care Now

## 2023-05-22 NOTE — Progress Notes (Signed)
Virtual Visit Consent   Morgan Marshall, you are scheduled for a virtual visit with a [facility] provider today. Just as with appointments in the office, your consent must be obtained to participate. Your consent will be active for this visit and any virtual visit you may have with one of our providers in the next 365 days. If you have a MyChart account, a copy of this consent can be sent to you electronically.  As this is a virtual visit, video technology does not allow for your provider to perform a traditional examination. This may limit your provider's ability to fully assess your condition. If your provider identifies any concerns that need to be evaluated in person or the need to arrange testing (such as labs, EKG, etc.), we will make arrangements to do so. Although advances in technology are sophisticated, we cannot ensure that it will always work on either your end or our end. If the connection with a video visit is poor, the visit may have to be switched to a telephone visit. With either a video or telephone visit, we are not always able to ensure that we have a secure connection.  By engaging in this virtual visit, you consent to the provision of healthcare and authorize for your insurance to be billed (if applicable) for the services provided during this visit. Depending on your insurance coverage, you may receive a charge related to this service.  I need to obtain your verbal consent now. Are you willing to proceed with your visit today? Trulee Bates has provided verbal consent on 05/22/2023 for a virtual visit (video or telephone). [name], PA-C  Date: 05/22/2023 3:23 PM  Virtual Visit via Video Note   IMargaretann Loveless, connected with  Lajoyce Corners  ([id number], [birth year]) on 05/22/23 at  3:15 PM EDT by a video-enabled telemedicine application and verified that I am speaking with the correct person using two identifiers.  Location: Patient: Virtual Visit Location Patient:  Home Provider: Virtual Visit Location Provider: Home Office   I discussed the limitations of evaluation and management by telemedicine and the availability of in person appointments. The patient expressed understanding and agreed to proceed.    History of Present Illness: Morgan Marshall is a 22 y.o. who identifies as a female who was assigned female at birth, and is being seen today for possible sinus infection and cough.  HPI: Sinusitis This is a new problem. The current episode started 1 to 4 weeks ago. The problem has been gradually worsening since onset. There has been no fever. The pain is moderate. Associated symptoms include congestion, coughing, headaches, sinus pressure, sneezing and a sore throat. Pertinent negatives include no chills or ear pain. Treatments tried: Mucinex fast acting sinus mac. The treatment provided no relief.   Seen in person Urgent Care on 05/19/23 and diagnosed with Viral URI.   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   Pain in left foot 09/16/2022   Abdominal pain 05/08/2022   Epigastric pain 05/06/2022   Asthma exacerbation, mild 07/18/2021   Acute mucoid otitis media of left ear 07/18/2021   GAD (generalized anxiety disorder) 12/31/2020   Nail avulsion, finger,  initial encounter 08/24/2019   Irregular periods 02/17/2018   Asthma 11/17/2017   Childhood obesity 11/17/2017    Allergies:  Allergies  Allergen Reactions   Latex Hives   Medications:  Current Outpatient Medications:    amoxicillin-clavulanate (AUGMENTIN) 875-125 MG tablet, Take 1 tablet by mouth 2 (two) times daily., Disp: 20 tablet,  Rfl: 0   predniSONE (STERAPRED UNI-PAK 21 TAB) 10 MG (21) TBPK tablet, 6 day taper; take as directed on package instructions, Disp: 21 tablet, Rfl: 0   promethazine-dextromethorphan (PROMETHAZINE-DM) 6.25-15 MG/5ML syrup, Take 5 mLs by mouth 4 (four) times daily as needed., Disp: 118 mL, Rfl: 0   albuterol (VENTOLIN HFA) 108 (90 Base) MCG/ACT inhaler, INHALE 1-2 PUFFS INTO THE LUNGS EVERY 6 HOURS AS NEEDED., Disp: 18 each, Rfl: 0   benzonatate (TESSALON PERLES) 100 MG capsule, Take 1 capsule (100 mg total) by mouth 3 (three) times daily as needed for cough., Disp: 30 capsule, Rfl: 0   buPROPion (WELLBUTRIN XL) 150 MG 24 hr tablet, Take 150 mg by mouth daily., Disp: , Rfl:    clonazePAM (KLONOPIN) 0.5 MG tablet, Take 0.5 mg by mouth 3 (three) times daily as needed for anxiety., Disp: , Rfl:    norethindrone-ethinyl estradiol-iron (AUROVELA FE 1.5/30) 1.5-30 MG-MCG tablet, Take 1 tablet by mouth daily., Disp: 90 tablet, Rfl: 3   Probiotic Product (PROBIOTIC DAILY PO), Take by mouth., Disp: , Rfl:   Observations/Objective: Patient is well-developed, well-nourished in no acute distress.  Resting comfortably at home.  Head is normocephalic, atraumatic.  No labored breathing.  Speech is clear and coherent with logical content.  Patient is alert and oriented at baseline.    Assessment and Plan: 1. Acute bacterial sinusitis - amoxicillin-clavulanate (AUGMENTIN) 875-125 MG tablet; Take 1 tablet by mouth 2 (two) times daily.  Dispense: 20 tablet; Refill: 0  2. Bronchitis - amoxicillin-clavulanate (AUGMENTIN) 875-125 MG tablet; Take 1 tablet by mouth 2 (two) times daily.  Dispense: 20 tablet; Refill: 0 - predniSONE (STERAPRED UNI-PAK 21 TAB) 10 MG (21) TBPK tablet; 6 day taper; take as directed on package instructions  Dispense: 21 tablet; Refill: 0 - promethazine-dextromethorphan (PROMETHAZINE-DM) 6.25-15 MG/5ML syrup; Take 5 mLs by mouth 4 (four) times daily as needed.  Dispense: 118 mL; Refill:  0  - Worsening symptoms that have not responded to OTC medications.  - Will give Augmentin - Continue albuterol as needed - Prednisone for chest tightness and wheezing - Promethazine DM for cough, has Tessalon perles on hand already  - Steam and humidifier can help - Stay well hydrated and get plenty of rest.  - Seek in person evaluation if no symptom improvement or if symptoms worsen   Follow Up Instructions: I discussed the assessment and treatment plan with the patient. The patient was provided an opportunity to ask questions and all were answered. The patient agreed with the plan and demonstrated an understanding of the instructions.  A copy of instructions were sent to the patient via MyChart unless otherwise noted below.    The patient was advised to call back or seek an in-person evaluation if the symptoms worsen or if the condition fails to improve as anticipated.  Time:  I spent 8 minutes with the patient via telehealth technology discussing the above problems/concerns.    [name], PA-C

## 2023-08-18 ENCOUNTER — Telehealth: Payer: BC Managed Care – PPO | Admitting: Physician Assistant

## 2023-08-18 DIAGNOSIS — J029 Acute pharyngitis, unspecified: Secondary | ICD-10-CM | POA: Diagnosis not present

## 2023-08-18 DIAGNOSIS — J069 Acute upper respiratory infection, unspecified: Secondary | ICD-10-CM | POA: Diagnosis not present

## 2023-08-18 NOTE — Progress Notes (Signed)
Virtual Visit Consent   Morgan Marshall, you are scheduled for a virtual visit with a [facility] provider today. Just as with appointments in the office, your consent must be obtained to participate. Your consent will be active for this visit and any virtual visit you may have with one of our providers in the next 365 days. If you have a MyChart account, a copy of this consent can be sent to you electronically.  As this is a virtual visit, video technology does not allow for your provider to perform a traditional examination. This may limit your provider's ability to fully assess your condition. If your provider identifies any concerns that need to be evaluated in person or the need to arrange testing (such as labs, EKG, etc.), we will make arrangements to do so. Although advances in technology are sophisticated, we cannot ensure that it will always work on either your end or our end. If the connection with a video visit is poor, the visit may have to be switched to a telephone visit. With either a video or telephone visit, we are not always able to ensure that we have a secure connection.  By engaging in this virtual visit, you consent to the provision of healthcare and authorize for your insurance to be billed (if applicable) for the services provided during this visit. Depending on your insurance coverage, you may receive a charge related to this service.  I need to obtain your verbal consent now. Are you willing to proceed with your visit today? Morgan Marshall has provided verbal consent on 08/18/2023 for a virtual visit (video or telephone). Tylene Fantasia Ward, PA-C  Date: 08/18/2023 6:11 PM  Virtual Visit via Video Note   I, Tylene Fantasia Ward, connected with  Morgan Marshall  ([id number], 2001/07/01) on 08/18/23 at  6:00 PM EST by a video-enabled telemedicine application and verified that I am speaking with the correct person using two identifiers.  Location: Patient: Virtual Visit Location Patient:  Home Provider: Virtual Visit Location Provider: Home Office   I discussed the limitations of evaluation and management by telemedicine and the availability of in person appointments. The patient expressed understanding and agreed to proceed.    History of Present Illness: Morgan Marshall is a 22 y.o. who identifies as a female who was assigned female at birth, and is being seen today for sore throat, congestion, cough that started today.  She was seen in UC earlier today, negative strep, negative COVID tests.  She is concerned she may need an antibiotic.  She denies fever, chills.  She is taking cold and cough medication with some relief. 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   Pain in left foot 09/16/2022   Abdominal pain 05/08/2022   Epigastric pain 05/06/2022   Asthma exacerbation, mild 07/18/2021   Acute mucoid otitis media of left ear 07/18/2021   GAD (generalized anxiety disorder) 12/31/2020   Nail avulsion, finger, initial encounter 08/24/2019   Irregular periods 02/17/2018   Asthma 11/17/2017   Childhood obesity 11/17/2017    Allergies:  Allergies  Allergen Reactions   Latex Hives   Medications:  Current Outpatient Medications:  albuterol (VENTOLIN HFA) 108 (90 Base) MCG/ACT inhaler, INHALE 1-2 PUFFS INTO THE LUNGS EVERY 6 HOURS AS NEEDED., Disp: 18 each, Rfl: 0   amoxicillin-clavulanate (AUGMENTIN) 875-125 MG tablet, Take 1 tablet by mouth 2 (two) times daily., Disp: 20 tablet, Rfl: 0   benzonatate (TESSALON PERLES) 100 MG capsule, Take 1 capsule (100 mg total) by mouth 3 (three) times daily as needed for  cough., Disp: 30 capsule, Rfl: 0   buPROPion (WELLBUTRIN XL) 150 MG 24 hr tablet, Take 150 mg by mouth daily., Disp: , Rfl:    clonazePAM (KLONOPIN) 0.5 MG tablet, Take 0.5 mg by mouth 3 (three) times daily as needed for anxiety., Disp: , Rfl:    norethindrone-ethinyl estradiol-iron (AUROVELA FE 1.5/30) 1.5-30 MG-MCG tablet, Take 1 tablet by mouth daily., Disp: 90 tablet, Rfl: 3   predniSONE (STERAPRED UNI-PAK 21 TAB) 10 MG (21) TBPK tablet, 6 day taper; take as directed on package instructions, Disp: 21 tablet, Rfl: 0   Probiotic Product (PROBIOTIC DAILY PO), Take by mouth., Disp: , Rfl:    promethazine-dextromethorphan (PROMETHAZINE-DM) 6.25-15 MG/5ML syrup, Take 5 mLs by mouth 4 (four) times daily as needed., Disp: 118 mL, Rfl: 0  Observations/Objective: Patient is well-developed, well-nourished in no acute distress.  Resting comfortably at home.  Head is normocephalic, atraumatic.  No labored breathing.  Speech is clear and coherent with logical content.  Patient is alert and oriented at baseline.    Assessment and Plan: 1. Viral URI with cough  Symptoms viral in nature, negative strep, negative fever.  Supportive care discussed.    Follow Up Instructions: I discussed the assessment and treatment plan with the patient. The patient was provided an opportunity to ask questions and all were answered. The patient agreed with the plan and demonstrated an understanding of the instructions.  A copy of instructions were sent to the patient via MyChart unless otherwise noted below.     The patient was advised to call back or seek an in-person evaluation if the symptoms worsen or if the condition fails to improve as anticipated.    Tylene Fantasia Ward, PA-C

## 2023-08-31 ENCOUNTER — Telehealth: Payer: BC Managed Care – PPO | Admitting: Nurse Practitioner

## 2023-08-31 ENCOUNTER — Encounter: Payer: Self-pay | Admitting: Nurse Practitioner

## 2023-08-31 DIAGNOSIS — R059 Cough, unspecified: Secondary | ICD-10-CM | POA: Diagnosis not present

## 2023-08-31 DIAGNOSIS — R112 Nausea with vomiting, unspecified: Secondary | ICD-10-CM | POA: Diagnosis not present

## 2023-08-31 DIAGNOSIS — J329 Chronic sinusitis, unspecified: Secondary | ICD-10-CM | POA: Diagnosis not present

## 2023-08-31 DIAGNOSIS — R062 Wheezing: Secondary | ICD-10-CM | POA: Diagnosis not present

## 2023-08-31 DIAGNOSIS — R0789 Other chest pain: Secondary | ICD-10-CM | POA: Diagnosis not present

## 2023-08-31 DIAGNOSIS — I10 Essential (primary) hypertension: Secondary | ICD-10-CM | POA: Diagnosis not present

## 2023-08-31 DIAGNOSIS — R9431 Abnormal electrocardiogram [ECG] [EKG]: Secondary | ICD-10-CM | POA: Diagnosis not present

## 2023-08-31 DIAGNOSIS — R0981 Nasal congestion: Secondary | ICD-10-CM | POA: Diagnosis not present

## 2023-08-31 DIAGNOSIS — R0602 Shortness of breath: Secondary | ICD-10-CM | POA: Diagnosis not present

## 2023-08-31 NOTE — Progress Notes (Signed)
Patient appears to be in ED at [facility] during time of visit. Link sent, patient called no answer. Will need to reschedule if she still needs appointment

## 2023-09-02 DIAGNOSIS — F410 Panic disorder [episodic paroxysmal anxiety] without agoraphobia: Secondary | ICD-10-CM | POA: Diagnosis not present

## 2023-09-02 DIAGNOSIS — F411 Generalized anxiety disorder: Secondary | ICD-10-CM | POA: Diagnosis not present

## 2023-09-02 DIAGNOSIS — F331 Major depressive disorder, recurrent, moderate: Secondary | ICD-10-CM | POA: Diagnosis not present

## 2023-09-02 DIAGNOSIS — F4312 Post-traumatic stress disorder, chronic: Secondary | ICD-10-CM | POA: Diagnosis not present

## 2023-11-20 ENCOUNTER — Telehealth: Admitting: Physician Assistant

## 2023-11-20 DIAGNOSIS — J019 Acute sinusitis, unspecified: Secondary | ICD-10-CM | POA: Diagnosis not present

## 2023-11-20 DIAGNOSIS — H6503 Acute serous otitis media, bilateral: Secondary | ICD-10-CM

## 2023-11-20 DIAGNOSIS — B9689 Other specified bacterial agents as the cause of diseases classified elsewhere: Secondary | ICD-10-CM

## 2023-11-20 MED ORDER — PREDNISONE 20 MG PO TABS: 40.0000 mg | ORAL_TABLET | Freq: Every day | ORAL | 0 refills | Status: DC

## 2023-11-20 MED ORDER — AMOXICILLIN-POT CLAVULANATE 875-125 MG PO TABS
1.0000 | ORAL_TABLET | Freq: Two times a day (BID) | ORAL | 0 refills | Status: DC
Start: 2023-11-20 — End: 2023-12-08

## 2023-11-20 NOTE — Progress Notes (Signed)
 Virtual Visit Consent   Morgan Marshall, you are scheduled for a virtual visit with a [facility] provider today. Just as with appointments in the office, your consent must be obtained to participate. Your consent will be active for this visit and any virtual visit you may have with one of our providers in the next 365 days. If you have a MyChart account, a copy of this consent can be sent to you electronically.  As this is a virtual visit, video technology does not allow for your provider to perform a traditional examination. This may limit your provider's ability to fully assess your condition. If your provider identifies any concerns that need to be evaluated in person or the need to arrange testing (such as labs, EKG, etc.), we will make arrangements to do so. Although advances in technology are sophisticated, we cannot ensure that it will always work on either your end or our end. If the connection with a video visit is poor, the visit may have to be switched to a telephone visit. With either a video or telephone visit, we are not always able to ensure that we have a secure connection.  By engaging in this virtual visit, you consent to the provision of healthcare and authorize for your insurance to be billed (if applicable) for the services provided during this visit. Depending on your insurance coverage, you may receive a charge related to this service.  I need to obtain your verbal consent now. Are you willing to proceed with your visit today? Chasity Outten has provided verbal consent on 11/20/2023 for a virtual visit (video or telephone). [name], PA-C  Date: 11/20/2023 4:05 PM   Virtual Visit via Video Note   I, [name], connected with  Morgan Marshall  ([id number], [birth year]) on 11/20/23 at  4:00 PM EST by a video-enabled telemedicine application and verified that I am speaking with the correct person using two identifiers.  Location: Patient: Virtual Visit Location Patient:  Home Provider: Virtual Visit Location Provider: Home Office   I discussed the limitations of evaluation and management by telemedicine and the availability of in person appointments. The patient expressed understanding and agreed to proceed.    History of Present Illness: Morgan Marshall is a 23 y.o. who identifies as a female who was assigned female at birth, and is being seen today for sinus congestion.  HPI: Sinusitis This is a new problem. The current episode started 1 to 4 weeks ago (2-3 weeks; worsened yesterday). The problem has been gradually worsening since onset. The maximum temperature recorded prior to her arrival was 100.4 - 100.9 F. The fever has been present for Less than 1 day. The pain is moderate. Associated symptoms include congestion, coughing, ear pain (bilateral), headaches, sinus pressure, a sore throat (from drainage, mostly in the morning) and swollen glands. Pertinent negatives include no chills, diaphoresis, hoarse voice or shortness of breath. (Post nasal drainage worse in the morning) Treatments tried: advil cold and sinus, tylenol, advil. The treatment provided no relief.  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   Pain in left foot 09/16/2022  Abdominal pain 05/08/2022   Epigastric pain 05/06/2022   Asthma exacerbation, mild 07/18/2021   Acute mucoid otitis media of left ear 07/18/2021   GAD (generalized anxiety disorder) 12/31/2020   Nail avulsion, finger, initial encounter 08/24/2019   Irregular periods 02/17/2018   Asthma 11/17/2017   Childhood obesity 11/17/2017     Allergies:  Allergies  Allergen Reactions   Latex Hives   Medications:  Current Outpatient Medications:    amoxicillin-clavulanate (AUGMENTIN) 875-125 MG tablet, Take 1 tablet by mouth 2 (two) times daily., Disp: 20 tablet, Rfl: 0   predniSONE (DELTASONE) 20 MG tablet, Take 2 tablets (40 mg total) by mouth daily with breakfast., Disp: 10 tablet, Rfl: 0   albuterol (VENTOLIN HFA) 108 (90 Base) MCG/ACT inhaler, INHALE 1-2 PUFFS INTO THE LUNGS EVERY 6 HOURS AS NEEDED., Disp: 18 each, Rfl: 0   benzonatate (TESSALON PERLES) 100 MG capsule, Take 1 capsule (100 mg total) by mouth 3 (three) times daily as needed for cough., Disp: 30 capsule, Rfl: 0   buPROPion (WELLBUTRIN XL) 150 MG 24 hr tablet, Take 150 mg by mouth daily., Disp: , Rfl:    clonazePAM (KLONOPIN) 0.5 MG tablet, Take 0.5 mg by mouth 3 (three) times daily as needed for anxiety., Disp: , Rfl:    norethindrone-ethinyl estradiol-iron (AUROVELA FE 1.5/30) 1.5-30 MG-MCG tablet, Take 1 tablet by mouth daily., Disp: 90 tablet, Rfl: 3   Probiotic Product (PROBIOTIC DAILY PO), Take by mouth., Disp: , Rfl:    promethazine-dextromethorphan (PROMETHAZINE-DM) 6.25-15 MG/5ML syrup, Take 5 mLs by mouth 4 (four) times daily as needed., Disp: 118 mL, Rfl: 0  Observations/Objective: Patient is well-developed, well-nourished in no acute distress.  Resting comfortably at home.  Head is normocephalic, atraumatic.  No labored breathing.  Speech is clear and coherent with logical content.  Patient is alert and oriented at baseline.    Assessment and Plan: 1. Acute bacterial sinusitis (Primary) - amoxicillin-clavulanate (AUGMENTIN) 875-125 MG tablet; Take 1 tablet by mouth 2 (two) times daily.  Dispense: 20 tablet; Refill: 0 - predniSONE (DELTASONE) 20 MG tablet; Take 2 tablets (40 mg total) by mouth daily with breakfast.  Dispense: 10 tablet; Refill: 0  2. Non-recurrent acute serous otitis media of both ears - amoxicillin-clavulanate (AUGMENTIN)  875-125 MG tablet; Take 1 tablet by mouth 2 (two) times daily.  Dispense: 20 tablet; Refill: 0 - predniSONE (DELTASONE) 20 MG tablet; Take 2 tablets (40 mg total) by mouth daily with breakfast.  Dispense: 10 tablet; Refill: 0  - Worsening symptoms that have not responded to OTC medications.  - Will give Augmentin and Prednisone - Continue allergy medications.  - Steam and humidifier can help - Stay well hydrated and get plenty of rest.  - Seek in person evaluation if no symptom improvement or if symptoms worsen   Follow Up Instructions: I discussed the assessment and treatment plan with the patient. The patient was provided an opportunity to ask questions and all were answered. The patient agreed with the plan and demonstrated an understanding of the instructions.  A copy of instructions were sent to the patient via MyChart unless otherwise noted below.    The patient was advised to call back or seek an in-person evaluation if the symptoms worsen or if the condition fails to improve as anticipated.    [name], PA-C

## 2023-11-20 NOTE — Patient Instructions (Signed)
 Morgan Marshall, thank you for joining [name], PA-C for today's virtual visit.  While this provider is not your primary care provider (PCP), if your PCP is located in our provider database this encounter information will be shared with them immediately following your visit.   A [facility] MyChart account gives you access to today's visit and all your visits, tests, and labs performed at [facility] " click here if you don't have a [facility] MyChart account or go to [email or web address]  Consent: (Patient) Morgan Marshall provided verbal consent for this virtual visit at the beginning of the encounter.  Current Medications:  Current Outpatient Medications:    amoxicillin-clavulanate (AUGMENTIN) 875-125 MG tablet, Take 1 tablet by mouth 2 (two) times daily., Disp: 20 tablet, Rfl: 0   predniSONE (DELTASONE) 20 MG tablet, Take 2 tablets (40 mg total) by mouth daily with breakfast., Disp: 10 tablet, Rfl: 0   albuterol (VENTOLIN HFA) 108 (90 Base) MCG/ACT inhaler, INHALE 1-2 PUFFS INTO THE LUNGS EVERY 6 HOURS AS NEEDED., Disp: 18 each, Rfl: 0   benzonatate (TESSALON PERLES) 100 MG capsule, Take 1 capsule (100 mg total) by mouth 3 (three) times daily as needed for cough., Disp: 30 capsule, Rfl: 0   buPROPion (WELLBUTRIN XL) 150 MG 24 hr tablet, Take 150 mg by mouth daily., Disp: , Rfl:    clonazePAM (KLONOPIN) 0.5 MG tablet, Take 0.5 mg by mouth 3 (three) times daily as needed for anxiety., Disp: , Rfl:    norethindrone-ethinyl estradiol-iron (AUROVELA FE 1.5/30) 1.5-30 MG-MCG tablet, Take 1 tablet by mouth daily., Disp: 90 tablet, Rfl: 3   Probiotic Product (PROBIOTIC DAILY PO), Take by mouth., Disp: , Rfl:    promethazine-dextromethorphan (PROMETHAZINE-DM) 6.25-15 MG/5ML syrup, Take 5 mLs by mouth 4 (four) times daily as needed., Disp: 118 mL, Rfl: 0   Medications ordered in this encounter:  Meds ordered this encounter  Medications   amoxicillin-clavulanate (AUGMENTIN)  875-125 MG tablet    Sig: Take 1 tablet by mouth 2 (two) times daily.    Dispense:  20 tablet    Refill:  0    Supervising Provider:   Merrilee Jansky [[id number]]   predniSONE (DELTASONE) 20 MG tablet    Sig: Take 2 tablets (40 mg total) by mouth daily with breakfast.    Dispense:  10 tablet    Refill:  0    Supervising Provider:   Merrilee Jansky [[id number]]     *If you need refills on other medications prior to your next appointment, please contact your pharmacy*  Follow-Up: Call back or seek an in-person evaluation if the symptoms worsen or if the condition fails to improve as anticipated.  [facility] [phone]  Other Instructions Sinus Infection, Adult A sinus infection, also called sinusitis, is inflammation of your sinuses. Sinuses are hollow spaces in the bones around your face. Your sinuses are located: Around your eyes. In the middle of your forehead. Behind your nose. In your cheekbones. Mucus normally drains out of your sinuses. When your nasal tissues become inflamed or swollen, mucus can become trapped or blocked. This allows bacteria, viruses, and fungi to grow, which leads to infection. Most infections of the sinuses are caused by a virus. A sinus infection can develop quickly. It can last for up to 4 weeks (acute) or for more than 12 weeks (chronic). A sinus infection often develops after a cold. What are the causes? This condition is caused by anything that creates swelling in  the sinuses or stops mucus from draining. This includes: Allergies. Asthma. Infection from bacteria or viruses. Deformities or blockages in your nose or sinuses. Abnormal growths in the nose (nasal polyps). Pollutants, such as chemicals or irritants in the air. Infection from fungi. This is rare. What increases the risk? You are more likely to develop this condition if you: Have a weak body defense system (immune system). Do a lot of swimming or diving. Overuse nasal  sprays. Smoke. What are the signs or symptoms? The main symptoms of this condition are pain and a feeling of pressure around the affected sinuses. Other symptoms include: Stuffy nose or congestion that makes it difficult to breathe through your nose. Thick yellow or greenish drainage from your nose. Tenderness, swelling, and warmth over the affected sinuses. A cough that may get worse at night. Decreased sense of smell and taste. Extra mucus that collects in the throat or the back of the nose (postnasal drip) causing a sore throat or bad breath. Tiredness (fatigue). Fever. How is this diagnosed? This condition is diagnosed based on: Your symptoms. Your medical history. A physical exam. Tests to find out if your condition is acute or chronic. This may include: Checking your nose for nasal polyps. Viewing your sinuses using a device that has a light (endoscope). Testing for allergies or bacteria. Imaging tests, such as an MRI or CT scan. In rare cases, a bone biopsy may be done to rule out more serious types of fungal sinus disease. How is this treated? Treatment for a sinus infection depends on the cause and whether your condition is chronic or acute. If caused by a virus, your symptoms should go away on their own within 10 days. You may be given medicines to relieve symptoms. They include: Medicines that shrink swollen nasal passages (decongestants). A spray that eases inflammation of the nostrils (topical intranasal corticosteroids). Rinses that help get rid of thick mucus in your nose (nasal saline washes). Medicines that treat allergies (antihistamines). Over-the-counter pain relievers. If caused by bacteria, your health care provider may recommend waiting to see if your symptoms improve. Most bacterial infections will get better without antibiotic medicine. You may be given antibiotics if you have: A severe infection. A weak immune system. If caused by narrow nasal passages or  nasal polyps, surgery may be needed. Follow these instructions at home: Medicines Take, use, or apply over-the-counter and prescription medicines only as told by your health care provider. These may include nasal sprays. If you were prescribed an antibiotic medicine, take it as told by your health care provider. Do not stop taking the antibiotic even if you start to feel better. Hydrate and humidify  Drink enough fluid to keep your urine pale yellow. Staying hydrated will help to thin your mucus. Use a cool mist humidifier to keep the humidity level in your home above 50%. Inhale steam for 10-15 minutes, 3-4 times a day, or as told by your health care provider. You can do this in the bathroom while a hot shower is running. Limit your exposure to cool or dry air. Rest Rest as much as possible. Sleep with your head raised (elevated). Make sure you get enough sleep each night. General instructions  Apply a warm, moist washcloth to your face 3-4 times a day or as told by your health care provider. This will help with discomfort. Use nasal saline washes as often as told by your health care provider. Wash your hands often with soap and water to reduce your  exposure to germs. If soap and water are not available, use hand sanitizer. Do not smoke. Avoid being around people who are smoking (secondhand smoke). Keep all follow-up visits. This is important. Contact a health care provider if: You have a fever. Your symptoms get worse. Your symptoms do not improve within 10 days. Get help right away if: You have a severe headache. You have persistent vomiting. You have severe pain or swelling around your face or eyes. You have vision problems. You develop confusion. Your neck is stiff. You have trouble breathing. These symptoms may be an emergency. Get help right away. Call 911. Do not wait to see if the symptoms will go away. Do not drive yourself to the hospital. Summary A sinus infection is  soreness and inflammation of your sinuses. Sinuses are hollow spaces in the bones around your face. This condition is caused by nasal tissues that become inflamed or swollen. The swelling traps or blocks the flow of mucus. This allows bacteria, viruses, and fungi to grow, which leads to infection. If you were prescribed an antibiotic medicine, take it as told by your health care provider. Do not stop taking the antibiotic even if you start to feel better. Keep all follow-up visits. This is important. This information is not intended to replace advice given to you by your health care provider. Make sure you discuss any questions you have with your health care provider. Document Revised: 08/06/2021 Document Reviewed: 08/06/2021 Elsevier Patient Education  2024 [employer]   If you have been instructed to have an in-person evaluation today at a local Urgent Care facility, please use the link below. It will take you to a list of all of our available [facility], including address, phone number and hours of operation. Please do not delay care.  [facility]  If you or a family member do not have a primary care provider, use the link below to schedule a visit and establish care. When you choose a [facility] primary care physician or advanced practice provider, you gain a long-term partner in health. Find a Primary Care Provider  Learn more about [facility] in-office and virtual care options:  - Get Care Now

## 2023-12-03 ENCOUNTER — Ambulatory Visit (INDEPENDENT_AMBULATORY_CARE_PROVIDER_SITE_OTHER)

## 2023-12-03 ENCOUNTER — Ambulatory Visit (INDEPENDENT_AMBULATORY_CARE_PROVIDER_SITE_OTHER): Admitting: Student

## 2023-12-03 DIAGNOSIS — M25512 Pain in left shoulder: Secondary | ICD-10-CM

## 2023-12-03 DIAGNOSIS — M5412 Radiculopathy, cervical region: Secondary | ICD-10-CM | POA: Diagnosis not present

## 2023-12-03 MED ORDER — METHYLPREDNISOLONE 4 MG PO TBPK: ORAL_TABLET | ORAL | 0 refills | Status: DC

## 2023-12-03 NOTE — Progress Notes (Signed)
 Chief Complaint: Left shoulder pain     History of Present Illness:    Morgan Marshall is a 23 y.o. right-hand-dominant female presenting today for evaluation of acute left shoulder pain.  She states that this began about 5 days ago with no known injury.  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  Pain initially began as soreness which over the last few days has worsened each day.  Pain is now severe and is radiating down the left arm with associated tingling in fingers 2 through 5.  Pain levels even caused an episode of vomiting yesterday.  She has been unable to hold essentially any weight as of yesterday.  Does have some pain at the base of the neck on the left side.  Has been taking Aleve and using ice and heat without much relief.   Surgical History:   None  PMH/PSH/Family History/Social History/Meds/Allergies:    Past Medical History:  Diagnosis Date   ADD (attention deficit disorder)    Asthma    Chicken pox    GERD (gastroesophageal reflux disease)    Jaundice    Urinary tract infection    Past Surgical History:  Procedure Laterality Date   CHOLECYSTECTOMY N/A 05/10/2022   Procedure: LAPAROSCOPIC CHOLECYSTECTOMY WITH INTRAOPERATIVE CHOLANGIOGRAM;  Surgeon: Karie Soda, MD;  Location: [facility];  Service: General;  Laterality: N/A;   ESOPHAGOGASTRODUODENOSCOPY N/A 05/07/2022   Procedure: ESOPHAGOGASTRODUODENOSCOPY (EGD);  Surgeon: Charna Elizabeth, MD;  Location: [facility];  Service: Gastroenterology;  Laterality: N/A;  epigastric pain, nausea and vomiting   TYMPANOSTOMY TUBE PLACEMENT     Social History   Socioeconomic History   Marital status: Single    Spouse name: Not on file   Number of children: Not on file   Years of education: Not on file   Highest education level: 12th grade  Occupational History   Not on file  Tobacco Use   Smoking status: Never   Smokeless tobacco: Never  Vaping Use   Vaping  status: Never Used  Substance and Sexual Activity   Alcohol use: Not Currently   Drug use: Not Currently   Sexual activity: Not on file  Other Topics Concern   Not on file  Social History Narrative   Not on file   Social Drivers of Health   Financial Resource Strain: Not on file  Food Insecurity: No Food Insecurity (12/03/2022)   Hunger Vital Sign    Worried About Running Out of Food in the Last Year: Never true    Ran Out of Food in the Last Year: Never true  Transportation Needs: No Transportation Needs (12/03/2022)   PRAPARE - Administrator, Civil Service (Medical): No    Lack of Transportation (Non-Medical): No  Physical Activity: Unknown (12/03/2022)   Exercise Vital Sign    Days of Exercise per Week: 3 days    Minutes of Exercise per Session: Not on file  Stress: Stress Concern Present (12/03/2022)   [employer] of Occupational Health - Occupational Stress Questionnaire    Feeling of Stress : Very much  Social Connections: Unknown (12/03/2022)   Social Connection and Isolation Panel [NHANES]    Frequency of Communication with Friends and Family: More than three times a week    Frequency of Social Gatherings with Friends  and Family: Once a week    Attends Religious Services: Never    Active Member of Clubs or Organizations: No    Attends Engineer, structural: Not on file    Marital Status: Patient declined   Family History  Problem Relation Age of Onset   Heart attack Mother    Hyperlipidemia Father    Asthma Brother    Arthritis Maternal Grandmother    Asthma Maternal Grandmother    Hyperlipidemia Maternal Grandmother    Hyperlipidemia Maternal Grandfather    Hyperlipidemia Paternal Grandmother    Early death Paternal Grandfather    Allergies  Allergen Reactions   Latex Hives   Current Outpatient Medications  Medication Sig Dispense Refill   albuterol (VENTOLIN HFA) 108 (90 Base) MCG/ACT inhaler INHALE 1-2 PUFFS INTO THE LUNGS EVERY 6  HOURS AS NEEDED. 18 each 0   amoxicillin-clavulanate (AUGMENTIN) 875-125 MG tablet Take 1 tablet by mouth 2 (two) times daily. 20 tablet 0   benzonatate (TESSALON PERLES) 100 MG capsule Take 1 capsule (100 mg total) by mouth 3 (three) times daily as needed for cough. 30 capsule 0   buPROPion (WELLBUTRIN XL) 150 MG 24 hr tablet Take 150 mg by mouth daily.     clonazePAM (KLONOPIN) 0.5 MG tablet Take 0.5 mg by mouth 3 (three) times daily as needed for anxiety.     norethindrone-ethinyl estradiol-iron (AUROVELA FE 1.5/30) 1.5-30 MG-MCG tablet Take 1 tablet by mouth daily. 90 tablet 3   predniSONE (DELTASONE) 20 MG tablet Take 2 tablets (40 mg total) by mouth daily with breakfast. 10 tablet 0   Probiotic Product (PROBIOTIC DAILY PO) Take by mouth.     promethazine-dextromethorphan (PROMETHAZINE-DM) 6.25-15 MG/5ML syrup Take 5 mLs by mouth 4 (four) times daily as needed. 118 mL 0   No current facility-administered medications for this visit.   No results found.  Review of Systems:   A ROS was performed including pertinent positives and negatives as documented in the HPI.  Physical Exam :   Constitutional: NAD and appears stated age Neurological: Alert and oriented Psych: Appropriate affect and cooperative There were no vitals taken for this visit.   Comprehensive Musculoskeletal Exam:    Tenderness with palpation in the left cervical and upper trapezius musculature without any midline cervical tenderness.  Full range of motion present in the cervical spine with flexion, extension, rotation, and sidebending.  Tenderness along the scapula, anterior/posterior glenohumeral joint, and lateral upper arm.  Grip strength 5/5 bilaterally.  Imaging:   Xray (left shoulder 3 views): Negative for bony abnormality   I personally reviewed and interpreted the radiographs.   Assessment:   23 y.o. female with 5-day history of worsening pain around the left shoulder.  X-rays taken today of the shoulder  appear negative.  Based on her exam today as well as history of pain and paresthesias rating down in the arm, I do believe this is consistent with cervical radiculopathy.  Discussed pathology in detail as well as treatment options.  I do believe she would benefit from a oral steroid taper so I will start her on a Medrol Dosepak today with some added caution as she was recently taking prednisone for an upper respiratory infection.  I do think she would benefit from physical therapy as well, particularly as she does have some pain and tightness in the upper back at baseline.  She is currently in college in the [place] area so we can plan to see her back on an as-needed  basis, but I would like to have her update [place] if symptoms worsen or do not improve.  Plan :    -Start Medrol Dosepak -Referral to physical therapy for cervical radiculopathy and return to clinic as needed     I personally saw and evaluated the patient, and participated in the management and treatment plan.  Hazle Nordmann, PA-C Orthopedics

## 2023-12-08 ENCOUNTER — Encounter (HOSPITAL_BASED_OUTPATIENT_CLINIC_OR_DEPARTMENT_OTHER): Payer: Self-pay

## 2023-12-08 ENCOUNTER — Telehealth: Admitting: Physician Assistant

## 2023-12-08 DIAGNOSIS — A084 Viral intestinal infection, unspecified: Secondary | ICD-10-CM | POA: Diagnosis not present

## 2023-12-08 MED ORDER — FAMOTIDINE 20 MG PO TABS: 20.0000 mg | ORAL_TABLET | Freq: Two times a day (BID) | ORAL | 0 refills | Status: DC

## 2023-12-08 MED ORDER — ONDANSETRON 4 MG PO TBDP
4.0000 mg | ORAL_TABLET | Freq: Three times a day (TID) | ORAL | 0 refills | Status: DC | PRN
Start: 2023-12-08 — End: 2024-06-16

## 2023-12-08 NOTE — Progress Notes (Signed)
 Virtual Visit Consent   Morgan Marshall, you are scheduled for a virtual visit with a [facility] provider today. Just as with appointments in the office, your consent must be obtained to participate. Your consent will be active for this visit and any virtual visit you may have with one of our providers in the next 365 days. If you have a MyChart account, a copy of this consent can be sent to you electronically.  As this is a virtual visit, video technology does not allow for your provider to perform a traditional examination. This may limit your provider's ability to fully assess your condition. If your provider identifies any concerns that need to be evaluated in person or the need to arrange testing (such as labs, EKG, etc.), we will make arrangements to do so. Although advances in technology are sophisticated, we cannot ensure that it will always work on either your end or our end. If the connection with a video visit is poor, the visit may have to be switched to a telephone visit. With either a video or telephone visit, we are not always able to ensure that we have a secure connection.  By engaging in this virtual visit, you consent to the provision of healthcare and authorize for your insurance to be billed (if applicable) for the services provided during this visit. Depending on your insurance coverage, you may receive a charge related to this service.  I need to obtain your verbal consent now. Are you willing to proceed with your visit today? Morgan Marshall has provided verbal consent on 12/08/2023 for a virtual visit (video or telephone). Piedad Climes, [place]  Date: 12/08/2023 2:35 PM   Virtual Visit via Video Note   I, Piedad Climes, connected with  Morgan Marshall  ([id number], 01/20/2001) on 12/08/23 at  2:15 PM EDT by a video-enabled telemedicine application and verified that I am speaking with the correct person using two identifiers.  Location: Patient: Virtual Visit Location  Patient: Home Provider: Virtual Visit Location Provider: Home Office   I discussed the limitations of evaluation and management by telemedicine and the availability of in person appointments. The patient expressed understanding and agreed to proceed.    History of Present Illness: Morgan Marshall is a 23 y.o. who identifies as a female who was assigned female at birth, and is being seen today for abrupt onset yesterday of GI symptoms. Notes having some ongoing mild issue with diarrhea since removal of gallbladder. Notes between 12:30a - 5:30a -- with diarrhea and nausea with vomiting. Denies hematemesis, melena or hematochezia. Denies fever but notes feeling hot last night. This has resolved since then.  Having difficulty keeping fluids in.  Roommate with [employer] recently. She has tested at home but negative. Denies URI symptoms.  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   Pain in left foot 09/16/2022   Abdominal pain 05/08/2022   Epigastric pain 05/06/2022   Asthma exacerbation, mild 07/18/2021   Acute mucoid otitis media of left ear 07/18/2021   GAD (generalized anxiety disorder) 12/31/2020   Nail avulsion, finger, initial encounter 08/24/2019   Irregular periods 02/17/2018   Asthma 11/17/2017  Childhood obesity 11/17/2017    Allergies:  Allergies  Allergen Reactions   Latex Hives   Medications:  Current Outpatient Medications:    famotidine (PEPCID) 20 MG tablet, Take 1 tablet (20 mg total) by mouth 2 (two) times daily., Disp: 14 tablet, Rfl: 0   ondansetron (ZOFRAN-ODT) 4 MG disintegrating tablet, Take 1 tablet (4 mg  total) by mouth every 8 (eight) hours as needed for nausea or vomiting., Disp: 20 tablet, Rfl: 0   albuterol (VENTOLIN HFA) 108 (90 Base) MCG/ACT inhaler, INHALE 1-2 PUFFS INTO THE LUNGS EVERY 6 HOURS AS NEEDED., Disp: 18 each, Rfl: 0   buPROPion (WELLBUTRIN XL) 150 MG 24 hr tablet, Take 150 mg by mouth daily., Disp: , Rfl:    clonazePAM (KLONOPIN) 0.5 MG tablet, Take 0.5 mg by mouth 3 (three) times daily as needed for anxiety., Disp: , Rfl:    methylPREDNISolone (MEDROL DOSEPAK) 4 MG TBPK tablet, Take per packet instructions, Disp: 1 each, Rfl: 0   norethindrone-ethinyl estradiol-iron (AUROVELA FE 1.5/30) 1.5-30 MG-MCG tablet, Take 1 tablet by mouth daily., Disp: 90 tablet, Rfl: 3   Probiotic Product (PROBIOTIC DAILY PO), Take by mouth., Disp: , Rfl:   Observations/Objective: Patient is well-developed, well-nourished in no acute distress.  Resting comfortably at home.  Head is normocephalic, atraumatic.  No labored breathing. Speech is clear and coherent with logical content.  Patient is alert and oriented at baseline.   Assessment and Plan: 1. Viral gastroenteritis (Primary) - ondansetron (ZOFRAN-ODT) 4 MG disintegrating tablet; Take 1 tablet (4 mg total) by mouth every 8 (eight) hours as needed for nausea or vomiting.  Dispense: 20 tablet; Refill: 0 - famotidine (PEPCID) 20 MG tablet; Take 1 tablet (20 mg total) by mouth 2 (two) times daily.  Dispense: 14 tablet; Refill: 0  Will have her repeat COVID test in 24 hours as a precaution. For now, will treat as viral gastroenteritis. Supportive measures and OTC medications reviewed. Start [employer] Zofran and Famotidine per orders. Strict ER precautions reviewed. School note provided.   Follow Up Instructions: I discussed the assessment and treatment plan with the patient. The patient was provided an opportunity to ask questions and all were answered. The patient agreed with the plan and demonstrated an understanding of the instructions.  A  copy of instructions were sent to the patient via MyChart unless otherwise noted below.   The patient was advised to call back or seek an in-person evaluation if the symptoms worsen or if the condition fails to improve as anticipated.    Piedad Climes, PA-C

## 2023-12-08 NOTE — Patient Instructions (Signed)
 Morgan Marshall, thank you for joining Piedad Climes, PA-C for today's virtual visit.  While this provider is not your primary care provider (PCP), if your PCP is located in our provider database this encounter information will be shared with them immediately following your visit.   A [facility] MyChart account gives you access to today's visit and all your visits, tests, and labs performed at [facility] " click here if you don't have a [facility] MyChart account or go to [email or web address]  Consent: (Patient) Morgan Marshall provided verbal consent for this virtual visit at the beginning of the encounter.  Current Medications:  Current Outpatient Medications:    famotidine (PEPCID) 20 MG tablet, Take 1 tablet (20 mg total) by mouth 2 (two) times daily., Disp: 14 tablet, Rfl: 0   ondansetron (ZOFRAN-ODT) 4 MG disintegrating tablet, Take 1 tablet (4 mg total) by mouth every 8 (eight) hours as needed for nausea or vomiting., Disp: 20 tablet, Rfl: 0   albuterol (VENTOLIN HFA) 108 (90 Base) MCG/ACT inhaler, INHALE 1-2 PUFFS INTO THE LUNGS EVERY 6 HOURS AS NEEDED., Disp: 18 each, Rfl: 0   buPROPion (WELLBUTRIN XL) 150 MG 24 hr tablet, Take 150 mg by mouth daily., Disp: , Rfl:    clonazePAM (KLONOPIN) 0.5 MG tablet, Take 0.5 mg by mouth 3 (three) times daily as needed for anxiety., Disp: , Rfl:    methylPREDNISolone (MEDROL DOSEPAK) 4 MG TBPK tablet, Take per packet instructions, Disp: 1 each, Rfl: 0   norethindrone-ethinyl estradiol-iron (AUROVELA FE 1.5/30) 1.5-30 MG-MCG tablet, Take 1 tablet by mouth daily., Disp: 90 tablet, Rfl: 3   Probiotic Product (PROBIOTIC DAILY PO), Take by mouth., Disp: , Rfl:    Medications ordered in this encounter:  Meds ordered this encounter  Medications   ondansetron (ZOFRAN-ODT) 4 MG disintegrating tablet    Sig: Take 1 tablet (4 mg total) by mouth every 8 (eight) hours as needed for nausea or vomiting.    Dispense:  20 tablet    Refill:   0    Supervising Provider:   Merrilee Jansky [[id number]]   famotidine (PEPCID) 20 MG tablet    Sig: Take 1 tablet (20 mg total) by mouth 2 (two) times daily.    Dispense:  14 tablet    Refill:  0    Supervising Provider:   Merrilee Jansky [[id number]]     *If you need refills on other medications prior to your next appointment, please contact your pharmacy*  Follow-Up: Call back or seek an in-person evaluation if the symptoms worsen or if the condition fails to improve as anticipated.  [facility] [phone]  Other Instructions Food Choices to Help Relieve Diarrhea, Adult Diarrhea can make you feel weak and cause you to become dehydrated. Dehydration is a condition in which there is not enough water or other fluids in the body. It is important to choose the right foods and drinks to: Relieve diarrhea. Replace lost fluids and nutrients. Prevent dehydration. What are tips for following this plan? Relieving diarrhea Avoid foods that make your diarrhea worse. These may include: Foods and drinks that are sweetened with high-fructose corn syrup, honey, or sweeteners such as xylitol, sorbitol, and mannitol. Check food labels for these ingredients. Fried, greasy, or spicy foods. Raw fruits and vegetables. Eat foods that are rich in probiotics. These include foods such as yogurt and fermented milk products. Probiotics can help increase healthy bacteria in your stomach and intestines (gastrointestinal or GI tract). This  may help digestion and stop diarrhea. If you have lactose intolerance, avoid dairy products. These may make your diarrhea worse. Take medicine to help stop diarrhea only as told by your health care provider. Replacing nutrients  Eat bland, easy-to-digest foods in small amounts as you are able, until your diarrhea starts to get better. These foods include bananas, applesauce, rice, toast, and crackers. Over time, add nutrient-rich foods as your body tolerates  them or as told by your health care provider. These include: Well-cooked protein foods, such as eggs, lean meats like fish or chicken without skin, and tofu. Peeled, seeded, and soft-cooked fruits and vegetables. Low-fat dairy products. Whole grains. Take vitamin and mineral supplements as told by your health care provider. Preventing dehydration  Start by sipping water or a solution to prevent dehydration (oral rehydration solution, or ORS). This is a drink that helps replace fluids and minerals your body has lost. You can buy an ORS at pharmacies and retail stores. Try to drink at least 8-10 cups (2,000-2,500 mL) of fluid each day to help replace lost fluids. If your urine is pale yellow, you are getting enough fluids. You may drink other liquids in addition to water, such as fruit juice that you have added water to (diluted fruit juice) or low-calorie sports drinks, as tolerated or as told by your health care provider. Avoid drinks with caffeine, such as coffee, tea, or soft drinks. Avoid alcohol. This information is not intended to replace advice given to you by your health care provider. Make sure you discuss any questions you have with your health care provider. Document Revised: 02/18/2022 Document Reviewed: 02/18/2022 Elsevier Patient Education  2024 Elsevier Inc.   If you have been instructed to have an in-person evaluation today at a local Urgent Care facility, please use the link below. It will take you to a list of all of our available [facility], including address, phone number and hours of operation. Please do not delay care.  [facility]  If you or a family member do not have a primary care provider, use the link below to schedule a visit and establish care. When you choose a [facility] primary care physician or advanced practice provider, you gain a long-term partner in health. Find a Primary Care Provider  Learn more about [facility]'s in-office  and virtual care options:  - Get Care Now

## 2023-12-09 ENCOUNTER — Other Ambulatory Visit (HOSPITAL_BASED_OUTPATIENT_CLINIC_OR_DEPARTMENT_OTHER): Payer: Self-pay | Admitting: Student

## 2023-12-09 MED ORDER — TRAMADOL HCL 50 MG PO TABS: 50.0000 mg | ORAL_TABLET | Freq: Four times a day (QID) | ORAL | 0 refills | Status: AC | PRN

## 2024-02-04 ENCOUNTER — Emergency Department (HOSPITAL_COMMUNITY)
Admission: EM | Admit: 2024-02-04 | Discharge: 2024-02-04 | Disposition: A | Discharge status: Home / Self Care | Attending: Emergency Medicine | Admitting: Emergency Medicine

## 2024-02-04 ENCOUNTER — Encounter (HOSPITAL_COMMUNITY): Payer: Self-pay | Admitting: Emergency Medicine

## 2024-02-04 ENCOUNTER — Emergency Department (HOSPITAL_COMMUNITY)

## 2024-02-04 ENCOUNTER — Other Ambulatory Visit: Payer: Self-pay

## 2024-02-04 DIAGNOSIS — Z91040 Latex allergy status: Secondary | ICD-10-CM | POA: Insufficient documentation

## 2024-02-04 DIAGNOSIS — R109 Unspecified abdominal pain: Secondary | ICD-10-CM | POA: Diagnosis present

## 2024-02-04 DIAGNOSIS — K5900 Constipation, unspecified: Secondary | ICD-10-CM | POA: Diagnosis not present

## 2024-02-04 DIAGNOSIS — R1084 Generalized abdominal pain: Secondary | ICD-10-CM

## 2024-02-04 DIAGNOSIS — R112 Nausea with vomiting, unspecified: Secondary | ICD-10-CM

## 2024-02-04 LAB — URINALYSIS, ROUTINE W REFLEX MICROSCOPIC
Bilirubin Urine: NEGATIVE
Glucose, UA: NEGATIVE mg/dL
Hgb urine dipstick: NEGATIVE
Ketones, ur: 20 mg/dL — AB
Leukocytes,Ua: NEGATIVE
Nitrite: NEGATIVE
Protein, ur: NEGATIVE mg/dL
Specific Gravity, Urine: 1.028 (ref 1.005–1.030)
pH: 5 (ref 5.0–8.0)

## 2024-02-04 LAB — COMPREHENSIVE METABOLIC PANEL WITH GFR
ALT: 20 U/L (ref 0–44)
AST: 19 U/L (ref 15–41)
Albumin: 4.2 g/dL (ref 3.5–5.0)
Alkaline Phosphatase: 50 U/L (ref 38–126)
Anion gap: 10 (ref 5–15)
BUN: 12 mg/dL (ref 6–20)
CO2: 19 mmol/L — ABNORMAL LOW (ref 22–32)
Calcium: 9.6 mg/dL (ref 8.9–10.3)
Chloride: 104 mmol/L (ref 98–111)
Creatinine, Ser: 0.8 mg/dL (ref 0.44–1.00)
GFR, Estimated: >60 mL/min (ref >60)
Glucose, Bld: 75 mg/dL (ref 70–99)
Potassium: 3.8 mmol/L (ref 3.5–5.1)
Sodium: 133 mmol/L — ABNORMAL LOW (ref 135–145)
Total Bilirubin: 1.3 mg/dL — ABNORMAL HIGH (ref 0.0–1.2)
Total Protein: 7.7 g/dL (ref 6.5–8.1)

## 2024-02-04 LAB — CBC
HCT: 43.4 % (ref 36.0–46.0)
Hemoglobin: 15.1 g/dL — ABNORMAL HIGH (ref 12.0–15.0)
MCH: 31.6 pg (ref 26.0–34.0)
MCHC: 34.8 g/dL (ref 30.0–36.0)
MCV: 90.8 fL (ref 80.0–100.0)
Platelets: 156 10*3/uL (ref 150–400)
RBC: 4.78 MIL/uL (ref 3.87–5.11)
RDW: 11.8 % (ref 11.5–15.5)
WBC: 7 10*3/uL (ref 4.0–10.5)
nRBC: 0 % (ref 0.0–0.2)

## 2024-02-04 LAB — PREGNANCY, URINE: Preg Test, Ur: NEGATIVE

## 2024-02-04 LAB — LIPASE, BLOOD: Lipase: 33 U/L (ref 11–51)

## 2024-02-04 LAB — HCG, SERUM, QUALITATIVE: Preg, Serum: NEGATIVE

## 2024-02-04 MED ORDER — IOHEXOL 300 MG/ML  SOLN
100.0000 mL | Freq: Once | INTRAMUSCULAR | Status: AC | PRN
  Administered 2024-02-04: 100 mL via INTRAVENOUS

## 2024-02-04 NOTE — ED Notes (Addendum)
 2 attempts at IV failed. US  will be used. EDP made aware.

## 2024-02-04 NOTE — Discharge Instructions (Signed)
 Purchase 1 small bottle of miralax.  Divide between two 32 ounce Gatorade's.  Drink 8 ounces every hour until you have 2 large bowel movements and feel better.  See your physician for recheck next week.  Discussed side effects of semaglutide.  Follow-up with gastroenterology for further evaluation.

## 2024-02-04 NOTE — ED Provider Notes (Signed)
 [place] EMERGENCY DEPARTMENT AT [facility] Provider Note   CSN: [id number] Arrival date & time: 02/04/24  1709     History  Chief Complaint  Patient presents with   Emesis   Constipation    Morgan Marshall is a 23 y.o. female.  Patient complains of abdominal pain.  Patient reports that she has been vomiting since Saturday night.  Patient reports that she has had multiple episodes of vomiting every day.  Patient has vomited once today.  Patient reports that she has not had a bowel movement in 1 week.  Patient states that she has taken stool softeners and 4 laxative pills. Pt reports she is concerned that she has an obstruction.  Pt had a cholecystectomy in 2023.   Patient has a past medical history of food intolerance.  Patient reports that she has had frequent episodes of diarrhea.  Patient was followed by a pediatric GI doctor and had endoscopy and colonoscopy as a child due to GI issues.  Patient denies any fever or chills.  Patient's mother reports that she thinks patient's symptoms began with a GI bug.  Patient also recently has started semaglutide for weight loss.  Patient reports that her dose was increased on Sunday.  The history is provided by the patient. No language interpreter was used.  Emesis Constipation Associated symptoms: vomiting        Home Medications Prior to Admission medications   Medication Sig Start Date End Date Taking? Authorizing Provider  albuterol  (VENTOLIN  HFA) 108 (90 Base) MCG/ACT inhaler INHALE 1-2 PUFFS INTO THE LUNGS EVERY 6 HOURS AS NEEDED. 03/02/23   Allwardt, Alyssa M, PA-C  buPROPion (WELLBUTRIN XL) 150 MG 24 hr tablet Take 150 mg by mouth daily.    [provider]  clonazePAM (KLONOPIN) 0.5 MG tablet Take 0.5 mg by mouth 3 (three) times daily as needed for anxiety.    [provider]  famotidine  (PEPCID ) 20 MG tablet Take 1 tablet (20 mg total) by mouth 2 (two) times daily. 12/08/23   Farris Hong, PA-C   methylPREDNISolone  (MEDROL  DOSEPAK) 4 MG TBPK tablet Take per packet instructions 12/03/23   Amedeo Jupiter, PA-C  norethindrone-ethinyl estradiol-iron (AUROVELA FE 1.5/30) 1.5-30 MG-MCG tablet Take 1 tablet by mouth daily. 05/14/23 08/12/23  Allwardt, Deleta Felix, PA-C  ondansetron  (ZOFRAN -ODT) 4 MG disintegrating tablet Take 1 tablet (4 mg total) by mouth every 8 (eight) hours as needed for nausea or vomiting. 12/08/23   Farris Hong, PA-C  Probiotic Product (PROBIOTIC DAILY PO) Take by mouth.    [provider]      Allergies    Latex    Review of Systems   Review of Systems  Gastrointestinal:  Positive for constipation and vomiting.  All other systems reviewed and are negative.   Physical Exam Updated Vital Signs BP (!) 117/91   Pulse 99   Temp 98.2 F (36.8 C) (Oral)   Resp 16   SpO2 100%  Physical Exam Vitals and nursing note reviewed.  Constitutional:      Appearance: She is well-developed.  HENT:     Head: Normocephalic.     Mouth/Throat:     Mouth: Mucous membranes are moist.  Cardiovascular:     Rate and Rhythm: Normal rate.  Pulmonary:     Effort: Pulmonary effort is normal.  Abdominal:     General: There is no distension.     Palpations: Abdomen is soft.     Tenderness: There is abdominal  tenderness.  Musculoskeletal:        General: Normal range of motion.     Cervical back: Normal range of motion.  Skin:    General: Skin is warm.  Neurological:     General: No focal deficit present.     Mental Status: She is alert and oriented to person, place, and time.  Psychiatric:        Mood and Affect: Mood normal.     ED Results / Procedures / Treatments   Labs (all labs ordered are listed, but only abnormal results are displayed) Labs Reviewed  COMPREHENSIVE METABOLIC PANEL WITH GFR - Abnormal; Notable for the following components:      Result Value   Sodium 133 (*)    CO2 19 (*)    Total Bilirubin 1.3 (*)    All other components within  normal limits  CBC - Abnormal; Notable for the following components:   Hemoglobin 15.1 (*)    All other components within normal limits  URINALYSIS, ROUTINE W REFLEX MICROSCOPIC - Abnormal; Notable for the following components:   Ketones, ur 20 (*)    All other components within normal limits  LIPASE, BLOOD  HCG, SERUM, QUALITATIVE  PREGNANCY, URINE    EKG None  Radiology CT ABDOMEN PELVIS W CONTRAST Result Date: 02/04/2024 CLINICAL DATA:  Lower back pain and vomiting. EXAM: CT ABDOMEN AND PELVIS WITH CONTRAST TECHNIQUE: Multidetector CT imaging of the abdomen and pelvis was performed using the standard protocol following bolus administration of intravenous contrast. RADIATION DOSE REDUCTION: This exam was performed according to the departmental dose-optimization program which includes automated exposure control, adjustment of the mA and/or kV according to patient size and/or use of iterative reconstruction technique. CONTRAST:  100mL OMNIPAQUE IOHEXOL 300 MG/ML  SOLN COMPARISON:  None Available. FINDINGS: Lower chest: No acute abnormality. Hepatobiliary: No focal liver abnormality is seen. Status post cholecystectomy. No biliary dilatation. Pancreas: Unremarkable. No pancreatic ductal dilatation or surrounding inflammatory changes. Spleen: A 4 mm splenic cyst versus hemangioma is noted. Adrenals/Urinary Tract: Adrenal glands are unremarkable. Kidneys are normal, without renal calculi, focal lesion, or hydronephrosis. The urinary bladder is poorly distended and subsequently limited in evaluation. Stomach/Bowel: Stomach is within normal limits. Appendix appears normal. No evidence of bowel wall thickening, distention, or inflammatory changes. Vascular/Lymphatic: No significant vascular findings are present. No enlarged abdominal or pelvic lymph nodes. Reproductive: Uterus and bilateral adnexa are unremarkable. Other: No abdominal wall hernia or abnormality. No abdominopelvic ascites. Musculoskeletal:  No acute or significant osseous findings. IMPRESSION: 1. No acute or active process within the abdomen or pelvis. 2. Evidence of prior cholecystectomy. Electronically Signed   By: Virgle Grime M.D.   On: 02/04/2024 20:59    Procedures Procedures    Medications Ordered in ED Medications  iohexol (OMNIPAQUE) 300 MG/ML solution 100 mL (100 mLs Intravenous Contrast Given 02/04/24 2035)    ED Course/ Medical Decision Making/ A&P                                 Medical Decision Making Patient complains of abdominal pain nausea and vomiting.  Patient reports that she has not had a bowel movement in several days.  Amount and/or Complexity of Data Reviewed Labs: ordered. Radiology: ordered.  Risk Prescription drug management.           Final Clinical Impression(s) / ED Diagnoses Final diagnoses:  Constipation, unspecified constipation type  Generalized abdominal pain  Nausea and vomiting, unspecified vomiting type    Rx / DC Orders ED Discharge Orders     None      An After Visit Summary was printed and given to the patient.    Sandi Crosby, PA-C 02/04/24 2144    Afton Horse T, DO 02/08/24 [phone]

## 2024-02-04 NOTE — ED Notes (Signed)
 CT called to notify pt has IV

## 2024-02-04 NOTE — ED Triage Notes (Signed)
 Patient has been vomiting since this past Saturday night. She now has low back pain and right lower rib cage pain. UC told her to come here because she has been constipated. They are worried about obstruction.

## 2024-02-04 NOTE — ED Provider Triage Note (Signed)
 Emergency Medicine Provider Triage Evaluation Note  Morgan Marshall , a 23 y.o. female  was evaluated in triage.  Pt complains of abdominal pain.    Review of Systems  Positive: Nausea, abdominal pain Negative: fever  Physical Exam  BP (!) 117/91   Pulse 99   Temp 98.2 F (36.8 C) (Oral)   Resp 16   SpO2 100%  Gen:   Awake, no distress   Resp:  Normal effort  MSK:   Moves extremities without difficulty  Other:  Abdomen soft tener right upper and lower abdomen  Medical Decision Making  Medically screening exam initiated at 5:42 PM.  Appropriate orders placed.  Morgan Marshall was informed that the remainder of the evaluation will be completed by another provider, this initial triage assessment does not replace that evaluation, and the importance of remaining in the ED until their evaluation is complete.     Sandi Crosby, PA-C 02/04/24 1745

## 2024-04-03 ENCOUNTER — Encounter: Payer: Self-pay | Admitting: Physician Assistant

## 2024-04-04 ENCOUNTER — Telehealth: Payer: Self-pay | Admitting: Physician Assistant

## 2024-04-04 ENCOUNTER — Other Ambulatory Visit: Payer: Self-pay | Admitting: Physician Assistant

## 2024-04-04 DIAGNOSIS — Z3009 Encounter for other general counseling and advice on contraception: Secondary | ICD-10-CM

## 2024-04-04 MED ORDER — NORETHIN ACE-ETH ESTRAD-FE 1.5-30 MG-MCG PO TABS: 1.0000 | ORAL_TABLET | Freq: Every day | ORAL | 3 refills | Status: DC

## 2024-04-04 NOTE — Telephone Encounter (Signed)
 Please see pt response and advise

## 2024-04-04 NOTE — Telephone Encounter (Signed)
 Copied from CRM [id number]. Topic: Clinical - Medication Refill >> Apr 04, 2024  1:53 PM [name] wrote: Medication: norethindrone-ethinyl estradiol-iron (AUROVELA FE 1.5/30) 1.5-30 MG-MCG tablet  Has the patient contacted their pharmacy? Patient submitted a request in MyChart.  Relayed the note that her provider would like to see her for an appointment to discuss concerns and refills. Patient scheduled for a physical in October, but patient is asking if the birth control can be filled today because she needs to take it today and if [facility] at [facility] [address] because [facility] is closer to her.   This is the patient's preferred pharmacy:  [id number] [place], [place] - [address] [place] [address] Phone: [phone] Fax: [phone]   Is this the correct pharmacy for this prescription? Yes If no, delete pharmacy and type the correct one.   Has the prescription been filled recently? No  Is the patient out of the medication? Yes  Has the patient been seen for an appointment in the last year OR does the patient have an upcoming appointment? Yes  Can we respond through MyChart? Yes  Agent: Please be advised that Rx refills may take up to 3 business days. We ask that you follow-up with your pharmacy.

## 2024-04-04 NOTE — Telephone Encounter (Signed)
 Duplicate request/msg. Pt has sent PCP Mychart messages and awaiting on reply from PCP

## 2024-04-04 NOTE — Telephone Encounter (Signed)
 Please see pt msg and request for refills and advise

## 2024-04-04 NOTE — Telephone Encounter (Unsigned)
 Copied from CRM [id number]. Topic: Clinical - Medication Refill >> Apr 04, 2024  2:03 PM Antonio H wrote: Medication: JUNEL  FE 1.5/30 1.5-30 MG-MCG tablet (patient has appointment for tomorrow and would like for medication to be filled today if possible, says she cannot go without it)   Has the patient contacted their pharmacy? Yes (Agent: If no, request that the patient contact the pharmacy for the refill. If patient does not wish to contact the pharmacy document the reason why and proceed with request.) (Agent: If yes, when and what did the pharmacy advise?) Out of refills   This is the patient's preferred pharmacy:  [id number] [place], [place] - [address] [place] [address] Phone: [phone] Fax: [phone]  Is this the correct pharmacy for this prescription? Yes If no, delete pharmacy and type the correct one.   Has the prescription been filled recently? No  Is the patient out of the medication? Yes  Has the patient been seen for an appointment in the last year OR does the patient have an upcoming appointment? Yes  Can we respond through MyChart? Yes  Agent: Please be advised that Rx refills may take up to 3 business days. We ask that you follow-up with your pharmacy.

## 2024-04-05 ENCOUNTER — Ambulatory Visit (INDEPENDENT_AMBULATORY_CARE_PROVIDER_SITE_OTHER): Admitting: Physician Assistant

## 2024-04-05 VITALS — BP 98/72 | HR 93 | Temp 98.2°F | Ht 68.0 in | Wt 220.4 lb

## 2024-04-05 DIAGNOSIS — F32A Depression, unspecified: Secondary | ICD-10-CM | POA: Diagnosis not present

## 2024-04-05 DIAGNOSIS — F419 Anxiety disorder, unspecified: Secondary | ICD-10-CM | POA: Diagnosis not present

## 2024-04-05 DIAGNOSIS — Z3009 Encounter for other general counseling and advice on contraception: Secondary | ICD-10-CM

## 2024-04-05 NOTE — Progress Notes (Signed)
 Patient ID: Morgan Marshall, female    DOB: 07-10-2001, 23 y.o.   MRN: [id number]   Assessment & Plan:  Counseling for birth control, oral contraceptives  Anxiety and depression      Assessment and Plan Assessment & Plan Weight management She has experienced weight gain post-cholecystectomy, reaching 250 lbs. Since initiating semaglutide in April, she has lost 20-30 lbs. Nausea, a side effect, has resolved. She reports lightheadedness upon standing, potentially due to weight loss, medication, or hydration issues. Her blood pressure is generally low, requiring careful hydration. - Continue semaglutide injections with follow-up every three weeks via video visit with [facility]. - Ensure adequate hydration and electrolyte intake to manage lightheadedness. - Monitor weight and blood pressure regularly.  Post-cholecystectomy gastrointestinal issues She reports ongoing gastrointestinal symptoms post-cholecystectomy, including postprandial sickness and a previous hospital visit for a resolved blockage. - Follow up with gastroenterologist for ongoing gastrointestinal symptoms.  Contraceptive management She is on Aurovela FE for contraception, well-tolerated after discontinuing a 90-day contraceptive due to continuous spotting. She has a three-month supply with refills available. - Continue Aurovela FE with current prescription and refills. - Schedule next visit in October for physical and contraceptive management.  Mental health management She has anxiety and depression, previously managed with Klonopin and Wellbutrin, which she discontinued. She reports a good mental state and plans to maintain psychiatric and therapeutic relationships. - Contact [facility] for psychiatric and counseling services. - Consider resuming medications if mental health status changes.  General Health Maintenance She is due for a physical in October for vaccinations and health maintenance. An OB  appointment is scheduled for November for a Pap smear and gynecological care. Blood work is advised during the OB appointment to minimize visits. - Schedule physical in October for vaccinations and health maintenance. - Attend OB appointment in November for Pap smear and gynecological care.      No follow-ups on file.    Subjective:    Chief Complaint  Patient presents with   Contraception    Pt in today for birth control refill; has started weight loss medication but cannot recall the name of the medication 20 units weekly; not showing in reconcile or medication history.     HPI Discussed the use of AI scribe software for clinical note transcription with the patient, who gave verbal consent to proceed.  History of Present Illness Morgan Marshall is a 23 year old female who presents for follow-up regarding weight management and birth control.  She started semaglutide injections at the end of April to aid in weight loss after significant weight gain following gallbladder surgery. Her weight increased to nearly 250 pounds, the heaviest she had ever been, and she struggled to lose weight despite feeling sick from eating. Since starting semaglutide, she has lost about 20 to 30 pounds. She experiences nausea as a side effect, but it has subsided. She meets with a doctor every three weeks via video visit to monitor her progress and submits her weight and blood work as part of the follow-up.  She has a history of gastrointestinal issues post-gallbladder removal, including a hospital visit for a blockage that was resolved with colonoscopy medication. She describes inconsistent reactions to food, where the same meal can make her sick on different days. She plans to follow up with her gastroenterologist to address these ongoing issues.  She is here for a refill of her birth control, Aurovela FE, which she has been taking for about a year. She previously tried  a 90-day birth control option but  experienced continuous spotting, which she found unacceptable. She has a three-month supply of Aurovela FE with refills available.  She reports a recent A1c level that was slightly elevated, which concerned her, and she plans to have it rechecked. She has an upcoming OB appointment in November for further evaluation and routine care.  She has a history of anxiety and depression for which she was prescribed Klonopin and Wellbutrin. She stopped taking these medications on her own and has been feeling stable without them. She plans to maintain a relationship with a psychiatrist and therapist for future support. She is considering contacting [facility] for ongoing care.  She experiences lightheadedness upon standing, which she attributes to her recent weight loss or medication.     Past Medical History:  Diagnosis Date   ADD (attention deficit disorder)    Asthma    Chicken pox    GERD (gastroesophageal reflux disease)    Jaundice    Urinary tract infection     Past Surgical History:  Procedure Laterality Date   CHOLECYSTECTOMY N/A 05/10/2022   Procedure: LAPAROSCOPIC CHOLECYSTECTOMY WITH INTRAOPERATIVE CHOLANGIOGRAM;  Surgeon: Sheldon Standing, MD;  Location: [facility];  Service: General;  Laterality: N/A;   ESOPHAGOGASTRODUODENOSCOPY N/A 05/07/2022   Procedure: ESOPHAGOGASTRODUODENOSCOPY (EGD);  Surgeon: Kristie Lamprey, MD;  Location: [facility];  Service: Gastroenterology;  Laterality: N/A;  epigastric pain, nausea and vomiting   TYMPANOSTOMY TUBE PLACEMENT      Family History  Problem Relation Age of Onset   Heart attack Mother    Hyperlipidemia Father    Asthma Brother    Arthritis Maternal Grandmother    Asthma Maternal Grandmother    Hyperlipidemia Maternal Grandmother    Hyperlipidemia Maternal Grandfather    Hyperlipidemia Paternal Grandmother    Early death Paternal Grandfather     Social History   Tobacco Use   Smoking status: Never   Smokeless tobacco:  Never  Vaping Use   Vaping status: Never Used  Substance Use Topics   Alcohol use: Not Currently   Drug use: Not Currently     Allergies  Allergen Reactions   Latex Hives    Review of Systems NEGATIVE UNLESS OTHERWISE INDICATED IN HPI      Objective:     BP 98/72 (BP Location: Right Arm, Patient Position: Sitting, Cuff Size: Normal)   Pulse 93   Temp 98.2 F (36.8 C) (Temporal)   Ht 5' 8 (1.727 m)   Wt 220 lb 6.4 oz (100 kg)   SpO2 98%   BMI 33.51 kg/m   Wt Readings from Last 3 Encounters:  04/05/24 220 lb 6.4 oz (100 kg)  03/02/23 229 lb (103.9 kg)  01/23/23 229 lb 9.6 oz (104.1 kg)    BP Readings from Last 3 Encounters:  04/05/24 98/72  02/04/24 138/68  01/23/23 98/66     Physical Exam Vitals and nursing note reviewed.  Constitutional:      Appearance: Normal appearance.  Eyes:     Extraocular Movements: Extraocular movements intact.     Conjunctiva/sclera: Conjunctivae normal.     Pupils: Pupils are equal, round, and reactive to light.  Cardiovascular:     Rate and Rhythm: Normal rate and regular rhythm.     Pulses: Normal pulses.  Pulmonary:     Effort: Pulmonary effort is normal.     Breath sounds: Normal breath sounds.  Skin:    Findings: No rash.  Neurological:     Mental Status:  She is alert.  Psychiatric:        Mood and Affect: Mood normal.             Juancarlos Crescenzo M Xavion Muscat, PA-C

## 2024-05-06 ENCOUNTER — Telehealth: Admitting: Physician Assistant

## 2024-05-06 DIAGNOSIS — J45901 Unspecified asthma with (acute) exacerbation: Secondary | ICD-10-CM

## 2024-05-06 MED ORDER — PREDNISONE 50 MG PO TABS
50.0000 mg | ORAL_TABLET | Freq: Every day | ORAL | 0 refills | Status: DC
Start: 2024-05-06 — End: 2024-05-09

## 2024-05-06 MED ORDER — BENZONATATE 100 MG PO CAPS: 100.0000 mg | ORAL_CAPSULE | Freq: Three times a day (TID) | ORAL | 0 refills | Status: AC

## 2024-05-06 NOTE — Progress Notes (Signed)
 [name], Morgan Marshall are scheduled for a virtual visit with your provider today.    Just as we do with appointments in the office, we must obtain your consent to participate.  Your consent will be active for this visit and any virtual visit you may have with one of our providers in the next 365 days.    If you have a MyChart account, I can also send a copy of this consent to you electronically.  All virtual visits are billed to your insurance company just like a traditional visit in the office.  As this is a virtual visit, video technology does not allow for your provider to perform a traditional examination.  This may limit your provider's ability to fully assess your condition.  If your provider identifies any concerns that need to be evaluated in person or the need to arrange testing such as labs, EKG, etc, we will make arrangements to do so.    Although advances in technology are sophisticated, we cannot ensure that it will always work on either your end or our end.  If the connection with a video visit is poor, we may have to switch to a telephone visit.  With either a video or telephone visit, we are not always able to ensure that we have a secure connection.   I need to obtain your verbal consent now.   Are you willing to proceed with your visit today?   Morgan Marshall has provided verbal consent on 05/06/2024 for a virtual visit (video or telephone).   Morgan Marshall, [place] 05/06/2024  10:24 AM   Date:  05/06/2024   ID:  Morgan Marshall, DOB [birth year], MRN [id number]  Patient Location: Home Provider Location: Home Office   Participants: Patient and Provider for Visit and Wrap up  Method of visit: Video  Location of Patient: Home Location of Provider: Home Office Consent was obtain for visit over the video. Services rendered by provider: Visit was performed via video  A video enabled telemedicine application was used and I verified that I am speaking with the correct person using two  identifiers.  PCP:  Allwardt, Morgan HERO, PA-C   Chief Complaint:  uri sxs  History of Present Illness:    Morgan Marshall is a 23 y.o. female with history as stated below. Presents video telehealth for an acute care visit  States that her air vents in her home have mold/dust and she was changing the filters recently and dust went into her face.   She reports sore throat, cough, wheezing, and chest tightness and discomfort with coughing. Denies fevers, persistent shortness of breath, leg swelling, calf pain.   Her symptoms are consistent with past episodes of bronchitis.  Past Medical, Surgical, Social History, Allergies, and Medications have been Reviewed.  Past Medical History:  Diagnosis Date   ADD (attention deficit disorder)    Asthma    Chicken pox    GERD (gastroesophageal reflux disease)    Jaundice    Urinary tract infection     No outpatient medications have been marked as taking for the 05/06/24 encounter (Video Visit) with [facility] PROVIDER.     Allergies:   Latex   ROS See HPI for history of present illness.  Physical Exam Constitutional:      Appearance: Normal appearance. She is not ill-appearing.  Pulmonary:     Effort: Pulmonary effort is normal.  Neurological:     Mental Status: She is alert.  MDM: Pt with cough and wheezing. Will tx with steroids and cough medication. Has albuterol  inhaler at home. Advised on plan for follow up and strict return precautions   Tests Ordered: No orders of the defined types were placed in this encounter.   Medication Changes: No orders of the defined types were placed in this encounter.    Disposition:  Follow up  Signed, Morgan GORMAN Snuffer, PA-C  05/06/2024 10:24 AM

## 2024-05-06 NOTE — Patient Instructions (Signed)
  Johnnie Gaudy, thank you for joining Lynden GORMAN Snuffer, PA-C for today's virtual visit.  While this provider is not your primary care provider (PCP), if your PCP is located in our provider database this encounter information will be shared with them immediately following your visit.   A [facility] MyChart account gives you access to today's visit and all your visits, tests, and labs performed at [facility]  click here if you don't have a [facility] MyChart account or go to [email or web address]  Consent: (Patient) Johnnie Gaudy provided verbal consent for this virtual visit at the beginning of the encounter.  Current Medications:  Current Outpatient Medications:    albuterol  (VENTOLIN  HFA) 108 (90 Base) MCG/ACT inhaler, INHALE 1-2 PUFFS INTO THE LUNGS EVERY 6 HOURS AS NEEDED., Disp: 18 each, Rfl: 0   amphetamine-dextroamphetamine (ADDERALL XR) 5 MG 24 hr capsule, Take 5 mg by mouth. (Patient not taking: Reported on 04/05/2024), Disp: , Rfl:    buPROPion (WELLBUTRIN XL) 150 MG 24 hr tablet, Take 150 mg by mouth daily., Disp: , Rfl:    clonazePAM (KLONOPIN) 0.5 MG tablet, Take 0.5 mg by mouth 3 (three) times daily as needed for anxiety., Disp: , Rfl:    famotidine  (PEPCID ) 20 MG tablet, Take 1 tablet (20 mg total) by mouth 2 (two) times daily., Disp: 14 tablet, Rfl: 0   methylPREDNISolone  (MEDROL  DOSEPAK) 4 MG TBPK tablet, Take per packet instructions (Patient not taking: Reported on 04/05/2024), Disp: 1 each, Rfl: 0   norethindrone-ethinyl estradiol-iron (AUROVELA FE 1.5/30) 1.5-30 MG-MCG tablet, Take 1 tablet by mouth daily., Disp: 90 tablet, Rfl: 3   ondansetron  (ZOFRAN -ODT) 4 MG disintegrating tablet, Take 1 tablet (4 mg total) by mouth every 8 (eight) hours as needed for nausea or vomiting. (Patient not taking: Reported on 04/05/2024), Disp: 20 tablet, Rfl: 0   Probiotic Product (PROBIOTIC DAILY PO), Take by mouth., Disp: , Rfl:    Medications ordered in this encounter:  No  orders of the defined types were placed in this encounter.    *If you need refills on other medications prior to your next appointment, please contact your pharmacy*  Follow-Up: Call back or seek an in-person evaluation if the symptoms worsen or if the condition fails to improve as anticipated.  [facility] [phone]  Other Instructions Take the prednisone  and tessalon  as directed.   Use the albuterol  inhaler every 4-6 hours for the next 5 days   Follow up with your regular doctor in 1 week for reassessment and seek care sooner if your symptoms worsen or fail to improve.  If you have been instructed to have an in-person evaluation today at a local Urgent Care facility, please use the link below. It will take you to a list of all of our available [facility], including address, phone number and hours of operation. Please do not delay care.  [facility]  If you or a family member do not have a primary care provider, use the link below to schedule a visit and establish care. When you choose a [facility] primary care physician or advanced practice provider, you gain a long-term partner in health. Find a Primary Care Provider  Learn more about [facility]'s in-office and virtual care options: [facility] - Get Care Now

## 2024-05-09 ENCOUNTER — Telehealth: Admitting: Physician Assistant

## 2024-05-09 DIAGNOSIS — B9689 Other specified bacterial agents as the cause of diseases classified elsewhere: Secondary | ICD-10-CM

## 2024-05-09 DIAGNOSIS — J069 Acute upper respiratory infection, unspecified: Secondary | ICD-10-CM | POA: Diagnosis not present

## 2024-05-09 MED ORDER — AZITHROMYCIN 250 MG PO TABS: ORAL_TABLET | ORAL | 0 refills | Status: AC

## 2024-05-09 MED ORDER — PREDNISONE 50 MG PO TABS: 50.0000 mg | ORAL_TABLET | Freq: Every day | ORAL | 0 refills | Status: DC

## 2024-05-09 MED ORDER — PROMETHAZINE-DM 6.25-15 MG/5ML PO SYRP: 5.0000 mL | ORAL_SOLUTION | Freq: Four times a day (QID) | ORAL | 0 refills | Status: DC | PRN

## 2024-05-09 NOTE — Patient Instructions (Signed)
 Morgan Marshall, thank you for joining [name], PA-C for today's virtual visit.  While this provider is not your primary care provider (PCP), if your PCP is located in our provider database this encounter information will be shared with them immediately following your visit.   A [facility] MyChart account gives you access to today's visit and all your visits, tests, and labs performed at [facility]  click here if you don't have a [facility] MyChart account or go to [email or web address]  Consent: (Patient) Morgan Marshall provided verbal consent for this virtual visit at the beginning of the encounter.  Current Medications:  Current Outpatient Medications:    azithromycin  (ZITHROMAX ) 250 MG tablet, Take 2 tablets on day 1, then 1 tablet daily on days 2 through 5, Disp: 6 tablet, Rfl: 0   predniSONE  (DELTASONE ) 50 MG tablet, Take 1 tablet (50 mg total) by mouth daily with breakfast., Disp: 5 tablet, Rfl: 0   promethazine -dextromethorphan (PROMETHAZINE -DM) 6.25-15 MG/5ML syrup, Take 5 mLs by mouth 4 (four) times daily as needed., Disp: 118 mL, Rfl: 0   albuterol  (VENTOLIN  HFA) 108 (90 Base) MCG/ACT inhaler, INHALE 1-2 PUFFS INTO THE LUNGS EVERY 6 HOURS AS NEEDED., Disp: 18 each, Rfl: 0   amphetamine-dextroamphetamine (ADDERALL XR) 5 MG 24 hr capsule, Take 5 mg by mouth. (Patient not taking: Reported on 04/05/2024), Disp: , Rfl:    benzonatate  (TESSALON ) 100 MG capsule, Take 1 capsule (100 mg total) by mouth every 8 (eight) hours for 5 days., Disp: 15 capsule, Rfl: 0   buPROPion (WELLBUTRIN XL) 150 MG 24 hr tablet, Take 150 mg by mouth daily., Disp: , Rfl:    clonazePAM (KLONOPIN) 0.5 MG tablet, Take 0.5 mg by mouth 3 (three) times daily as needed for anxiety., Disp: , Rfl:    famotidine  (PEPCID ) 20 MG tablet, Take 1 tablet (20 mg total) by mouth 2 (two) times daily., Disp: 14 tablet, Rfl: 0   norethindrone-ethinyl estradiol-iron (AUROVELA FE 1.5/30) 1.5-30 MG-MCG tablet,  Take 1 tablet by mouth daily., Disp: 90 tablet, Rfl: 3   ondansetron  (ZOFRAN -ODT) 4 MG disintegrating tablet, Take 1 tablet (4 mg total) by mouth every 8 (eight) hours as needed for nausea or vomiting. (Patient not taking: Reported on 04/05/2024), Disp: 20 tablet, Rfl: 0   Probiotic Product (PROBIOTIC DAILY PO), Take by mouth., Disp: , Rfl:    Medications ordered in this encounter:  Meds ordered this encounter  Medications   predniSONE  (DELTASONE ) 50 MG tablet    Sig: Take 1 tablet (50 mg total) by mouth daily with breakfast.    Dispense:  5 tablet    Refill:  0    Supervising Provider:   LAMPTEY, PHILIP O [[id number]]   azithromycin  (ZITHROMAX ) 250 MG tablet    Sig: Take 2 tablets on day 1, then 1 tablet daily on days 2 through 5    Dispense:  6 tablet    Refill:  0    Supervising Provider:   LAMPTEY, PHILIP O [[id number]]   promethazine -dextromethorphan (PROMETHAZINE -DM) 6.25-15 MG/5ML syrup    Sig: Take 5 mLs by mouth 4 (four) times daily as needed.    Dispense:  118 mL    Refill:  0    Supervising Provider:   BLAISE ALEENE KIDD [[id number]]     *If you need refills on other medications prior to your next appointment, please contact your pharmacy*  Follow-Up: Call back or seek an in-person evaluation if the symptoms worsen or if the condition fails to improve  as anticipated.  [facility] [phone]  Other Instructions  Upper Respiratory Infection, Adult An upper respiratory infection (URI) is a common viral infection of the nose, throat, and upper air passages that lead to the lungs. The most common type of URI is the common cold. URIs usually get better on their own, without medical treatment. What are the causes? A URI is caused by a virus. You may catch a virus by: Breathing in droplets from an infected person's cough or sneeze. Touching something that has been exposed to the virus (is contaminated) and then touching your mouth, nose, or eyes. What increases the  risk? You are more likely to get a URI if: You are very young or very old. You have close contact with others, such as at work, school, or a health care facility. You smoke. You have long-term (chronic) heart or lung disease. You have a weakened disease-fighting system (immune system). You have nasal allergies or asthma. You are experiencing a lot of stress. You have poor nutrition. What are the signs or symptoms? A URI usually involves some of the following symptoms: Runny or stuffy (congested) nose. Cough. Sneezing. Sore throat. Headache. Fatigue. Fever. Loss of appetite. Pain in your forehead, behind your eyes, and over your cheekbones (sinus pain). Muscle aches. Redness or irritation of the eyes. Pressure in the ears or face. How is this diagnosed? This condition may be diagnosed based on your medical history and symptoms, and a physical exam. Your health care provider may use a swab to take a mucus sample from your nose (nasal swab). This sample can be tested to determine what virus is causing the illness. How is this treated? URIs usually get better on their own within 7-10 days. Medicines cannot cure URIs, but your health care provider may recommend certain medicines to help relieve symptoms, such as: Over-the-counter cold medicines. Cough suppressants. Coughing is a type of defense against infection that helps to clear the respiratory system, so take these medicines only as recommended by your health care provider. Fever-reducing medicines. Follow these instructions at home: Activity Rest as needed. If you have a fever, stay home from work or school until your fever is gone or until your health care provider says your URI cannot spread to other people (is no longer contagious). Your health care provider may have you wear a face mask to prevent your infection from spreading. Relieving symptoms Gargle with a mixture of salt and water  3-4 times a day or as needed. To make  salt water , completely dissolve -1 tsp (3-6 g) of salt in 1 cup (237 mL) of warm water . Use a cool-mist humidifier to add moisture to the air. This can help you breathe more easily. Eating and drinking  Drink enough fluid to keep your urine pale yellow. Eat soups and other clear broths. General instructions  Take over-the-counter and prescription medicines only as told by your health care provider. These include cold medicines, fever reducers, and cough suppressants. Do not use any products that contain nicotine or tobacco. These products include cigarettes, chewing tobacco, and vaping devices, such as e-cigarettes. If you need help quitting, ask your health care provider. Stay away from secondhand smoke. Stay up to date on all immunizations, including the yearly (annual) flu vaccine. Keep all follow-up visits. This is important. How to prevent the spread of infection to others URIs can be contagious. To prevent the infection from spreading: Wash your hands with soap and water  for at least 20 seconds.  If soap and water  are not available, use hand sanitizer. Avoid touching your mouth, face, eyes, or nose. Cough or sneeze into a tissue or your sleeve or elbow instead of into your hand or into the air.  Contact a health care provider if: You are getting worse instead of better. You have a fever or chills. Your mucus is brown or red. You have yellow or brown discharge coming from your nose. You have pain in your face, especially when you bend forward. You have swollen neck glands. You have pain while swallowing. You have white areas in the back of your throat. Get help right away if: You have shortness of breath that gets worse. You have severe or persistent: Headache. Ear pain. Sinus pain. Chest pain. You have chronic lung disease along with any of the following: Making high-pitched whistling sounds when you breathe, most often when you breathe out (wheezing). Prolonged cough (more  than 14 days). Coughing up blood. A change in your usual mucus. You have a stiff neck. You have changes in your: Vision. Hearing. Thinking. Mood. These symptoms may be an emergency. Get help right away. Call 911. Do not wait to see if the symptoms will go away. Do not drive yourself to the hospital. Summary An upper respiratory infection (URI) is a common infection of the nose, throat, and upper air passages that lead to the lungs. A URI is caused by a virus. URIs usually get better on their own within 7-10 days. Medicines cannot cure URIs, but your health care provider may recommend certain medicines to help relieve symptoms. This information is not intended to replace advice given to you by your health care provider. Make sure you discuss any questions you have with your health care provider. Document Revised: 04/03/2021 Document Reviewed: 04/03/2021 Elsevier Patient Education  2024 Elsevier Inc.   If you have been instructed to have an in-person evaluation today at a local Urgent Care facility, please use the link below. It will take you to a list of all of our available [facility], including address, phone number and hours of operation. Please do not delay care.  [facility]  If you or a family member do not have a primary care provider, use the link below to schedule a visit and establish care. When you choose a [facility] primary care physician or advanced practice provider, you gain a long-term partner in health. Find a Primary Care Provider  Learn more about [facility]'s in-office and virtual care options: [facility] - Get Care Now

## 2024-05-09 NOTE — Progress Notes (Signed)
 Virtual Visit Consent   Morgan Marshall, you are scheduled for a virtual visit with a [facility] provider today. Just as with appointments in the office, your consent must be obtained to participate. Your consent will be active for this visit and any virtual visit you may have with one of our providers in the next 365 days. If you have a MyChart account, a copy of this consent can be sent to you electronically.  As this is a virtual visit, video technology does not allow for your provider to perform a traditional examination. This may limit your provider's ability to fully assess your condition. If your provider identifies any concerns that need to be evaluated in person or the need to arrange testing (such as labs, EKG, etc.), we will make arrangements to do so. Although advances in technology are sophisticated, we cannot ensure that it will always work on either your end or our end. If the connection with a video visit is poor, the visit may have to be switched to a telephone visit. With either a video or telephone visit, we are not always able to ensure that we have a secure connection.  By engaging in this virtual visit, you consent to the provision of healthcare and authorize for your insurance to be billed (if applicable) for the services provided during this visit. Depending on your insurance coverage, you may receive a charge related to this service.  I need to obtain your verbal consent now. Are you willing to proceed with your visit today? Morgan Marshall has provided verbal consent on 05/09/2024 for a virtual visit (video or telephone). Morgan CHRISTELLA Dickinson, PA-C  Date: 05/09/2024 2:38 PM   Virtual Visit via Video Note   IDelon CHRISTELLA Marshall, connected with  Morgan Marshall  ([id number], 01-17-2001) on 05/09/24 at  2:30 PM EDT by a video-enabled telemedicine application and verified that I am speaking with the correct person using two identifiers.  Location: Patient: Virtual Visit Location  Patient: Home Provider: Virtual Visit Location Provider: Home Office   I discussed the limitations of evaluation and management by telemedicine and the availability of in person appointments. The patient expressed understanding and agreed to proceed.    History of Present Illness: Morgan Marshall is a 23 y.o. who identifies as a female who was assigned female at birth, and is being seen today for worsening URI symptoms.  HPI: URI  This is a new problem. The current episode started in the past 7 days (Completed Virtual visit on Friday, 05/06/24 for same issue; had what was felt to have been an asthma exacerbation due to dust from changing air filters; treated with Prednisone  50mg ). The problem has been gradually worsening. There has been no fever. Associated symptoms include congestion, coughing, headaches, a plugged ear sensation, rhinorrhea, sinus pain, a sore throat (after coughing), vomiting (from coughing) and wheezing. Pertinent negatives include no diarrhea, ear pain or nausea. Associated symptoms comments: Feels swollen in face and around eyes. She has tried inhaler use (prednisone , tessalon  perles) for the symptoms. The treatment provided no relief.     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   Pain in left foot 09/16/2022  Abdominal pain 05/08/2022   Epigastric pain 05/06/2022   Asthma exacerbation, mild 07/18/2021   Acute mucoid otitis media of left ear 07/18/2021   GAD (generalized anxiety disorder) 12/31/2020   Nail avulsion, finger, initial encounter 08/24/2019   Irregular periods 02/17/2018   Asthma 11/17/2017   Childhood  obesity 11/17/2017    Allergies:  Allergies  Allergen Reactions   Latex Hives   Medications:  Current Outpatient Medications:    azithromycin  (ZITHROMAX ) 250 MG tablet, Take 2 tablets on day 1, then 1 tablet daily on days 2 through 5, Disp: 6 tablet, Rfl: 0   predniSONE  (DELTASONE ) 50 MG tablet, Take 1 tablet (50 mg total) by mouth daily with breakfast., Disp: 5 tablet, Rfl: 0   promethazine -dextromethorphan (PROMETHAZINE -DM) 6.25-15 MG/5ML syrup, Take 5 mLs by mouth 4 (four) times daily as needed., Disp: 118 mL, Rfl: 0   albuterol  (VENTOLIN  HFA) 108 (90 Base) MCG/ACT inhaler, INHALE 1-2 PUFFS INTO THE LUNGS EVERY 6 HOURS AS NEEDED., Disp: 18 each, Rfl: 0   amphetamine-dextroamphetamine (ADDERALL XR) 5 MG 24 hr capsule, Take 5 mg by mouth. (Patient not taking: Reported on 04/05/2024), Disp: , Rfl:    benzonatate  (TESSALON ) 100 MG capsule, Take 1 capsule (100 mg total) by mouth every 8 (eight) hours for 5 days., Disp: 15 capsule, Rfl: 0   buPROPion (WELLBUTRIN XL) 150 MG 24 hr tablet, Take 150 mg by mouth daily., Disp: , Rfl:    clonazePAM (KLONOPIN) 0.5 MG tablet, Take 0.5 mg by mouth 3 (three) times daily as needed for anxiety., Disp: , Rfl:    famotidine  (PEPCID ) 20 MG tablet, Take 1 tablet (20 mg total) by mouth 2 (two) times daily., Disp: 14 tablet, Rfl: 0   norethindrone-ethinyl estradiol-iron (AUROVELA FE 1.5/30) 1.5-30 MG-MCG tablet, Take 1 tablet by mouth daily., Disp: 90 tablet, Rfl: 3   ondansetron  (ZOFRAN -ODT) 4 MG disintegrating tablet, Take 1 tablet (4 mg total) by mouth every 8 (eight) hours as needed for nausea or vomiting. (Patient not taking: Reported on 04/05/2024), Disp: 20 tablet, Rfl: 0   Probiotic Product (PROBIOTIC DAILY PO), Take by mouth., Disp: , Rfl:   Observations/Objective: Patient is well-developed, well-nourished in no acute distress.  Resting comfortably at home.  Head is normocephalic, atraumatic.  No labored breathing.  Speech is clear and coherent with  logical content.  Patient is alert and oriented at baseline.    Assessment and Plan: 1. Bacterial upper respiratory infection (Primary) - predniSONE  (DELTASONE ) 50 MG tablet; Take 1 tablet (50 mg total) by mouth daily with breakfast.  Dispense: 5 tablet; Refill: 0 - azithromycin  (ZITHROMAX ) 250 MG tablet; Take 2 tablets on day 1, then 1 tablet daily on days 2 through 5  Dispense: 6 tablet; Refill: 0 - promethazine -dextromethorphan (PROMETHAZINE -DM) 6.25-15 MG/5ML syrup; Take 5 mLs by mouth 4 (four) times daily as needed.  Dispense: 118 mL; Refill: 0  - Worsening over a week despite OTC medications - Will treat with Z-pack, continued Prednisone , and Promethazine  DM cough syrup - Can continue Mucinex  (PLAIN) - Continue albuterol  inhaler as needed - Push fluids.  - Rest.  - Steam and humidifier can help - School note provided - Seek in person evaluation if worsening or symptoms fail to improve    Follow Up Instructions: I discussed the assessment and treatment plan with the patient. The patient was provided an opportunity to ask questions and all were answered. The patient agreed with the plan and demonstrated an understanding of the instructions.  A copy of  instructions were sent to the patient via MyChart unless otherwise noted below.    The patient was advised to call back or seek an in-person evaluation if the symptoms worsen or if the condition fails to improve as anticipated.    Morgan CHRISTELLA Dickinson, PA-C

## 2024-05-11 ENCOUNTER — Ambulatory Visit: Admitting: Physician Assistant

## 2024-06-16 ENCOUNTER — Ambulatory Visit (INDEPENDENT_AMBULATORY_CARE_PROVIDER_SITE_OTHER): Admitting: Physician Assistant

## 2024-06-16 ENCOUNTER — Encounter: Payer: Self-pay | Admitting: Physician Assistant

## 2024-06-16 VITALS — BP 104/66 | HR 82 | Temp 98.2°F | Ht 68.0 in | Wt 207.2 lb

## 2024-06-16 DIAGNOSIS — F17291 Nicotine dependence, other tobacco product, in remission: Secondary | ICD-10-CM

## 2024-06-16 DIAGNOSIS — R110 Nausea: Secondary | ICD-10-CM

## 2024-06-16 DIAGNOSIS — E66811 Obesity, class 1: Secondary | ICD-10-CM

## 2024-06-16 DIAGNOSIS — F411 Generalized anxiety disorder: Secondary | ICD-10-CM

## 2024-06-16 DIAGNOSIS — R1013 Epigastric pain: Secondary | ICD-10-CM

## 2024-06-16 DIAGNOSIS — R42 Dizziness and giddiness: Secondary | ICD-10-CM

## 2024-06-16 DIAGNOSIS — Z3009 Encounter for other general counseling and advice on contraception: Secondary | ICD-10-CM

## 2024-06-16 LAB — CBC WITH DIFFERENTIAL/PLATELET
Basophils Absolute: 0 K/uL (ref 0.0–0.1)
Basophils Relative: 0.5 % (ref 0.0–3.0)
Eosinophils Absolute: 0 K/uL (ref 0.0–0.7)
Eosinophils Relative: 1 % (ref 0.0–5.0)
HCT: 39.4 % (ref 36.0–46.0)
Hemoglobin: 13.3 g/dL (ref 12.0–15.0)
Lymphocytes Relative: 34.4 % (ref 12.0–46.0)
Lymphs Abs: 1.8 K/uL (ref 0.7–4.0)
MCHC: 33.7 g/dL (ref 30.0–36.0)
MCV: 90.5 fl (ref 78.0–100.0)
Monocytes Absolute: 0.3 K/uL (ref 0.1–1.0)
Monocytes Relative: 5.5 % (ref 3.0–12.0)
Neutro Abs: 3 K/uL (ref 1.4–7.7)
Neutrophils Relative %: 58.6 % (ref 43.0–77.0)
Platelets: 170 K/uL (ref 150.0–400.0)
RBC: 4.36 Mil/uL (ref 3.87–5.11)
RDW: 12.9 % (ref 11.5–15.5)
WBC: 5.1 K/uL (ref 4.0–10.5)

## 2024-06-16 LAB — LIPASE: Lipase: 22 U/L (ref 11.0–59.0)

## 2024-06-16 LAB — COMPREHENSIVE METABOLIC PANEL WITH GFR
ALT: 17 U/L (ref 0–35)
AST: 16 U/L (ref 0–37)
Albumin: 4 g/dL (ref 3.5–5.2)
Alkaline Phosphatase: 41 U/L (ref 39–117)
BUN: 6 mg/dL (ref 6–23)
CO2: 23 meq/L (ref 19–32)
Calcium: 9 mg/dL (ref 8.4–10.5)
Chloride: 107 meq/L (ref 96–112)
Creatinine, Ser: 0.89 mg/dL (ref 0.40–1.20)
GFR: 92.56 mL/min (ref >60.00)
Glucose, Bld: 77 mg/dL (ref 70–99)
Potassium: 3.7 meq/L (ref 3.5–5.1)
Sodium: 139 meq/L (ref 135–145)
Total Bilirubin: 0.5 mg/dL (ref 0.2–1.2)
Total Protein: 6.7 g/dL (ref 6.0–8.3)

## 2024-06-16 NOTE — Progress Notes (Signed)
 Patient ID: Morgan Marshall, female    DOB: 21-Nov-2000, 21 y.o.   MRN: [id number]   Assessment & Plan:  Epigastric pain -     Ambulatory referral to Gastroenterology -     CBC with Differential/Platelet -     Comprehensive metabolic panel with GFR -     Lipase -     H. pylori breath test -     Celiac Disease Ab Screen w/Rfx  Nausea -     Ambulatory referral to Gastroenterology -     CBC with Differential/Platelet -     Comprehensive metabolic panel with GFR -     Lipase -     H. 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 Differential diagnosis includes POTS, dehydration, and anxiety. Symptoms include daily nausea, intermittent vomiting, dizziness, and chest tightness with numbness in the left arm and neck. Heart rate increases to 130 bpm during episodes. Symptoms are not clearly linked to semaglutide use. Consideration of POTS due to postural changes and heart rate variability. - Order blood work to check electrolytes and lipase levels - Consider H. pylori breath test - Increase water  intake and reduce soda consumption - Consider wearing compression stockings - Increase salt intake  Chronic gastrointestinal symptoms post-cholecystectomy Chronic diarrhea and abdominal pain post-cholecystectomy. Symptoms persist despite dietary modifications. Previous gallbladder removal due to similar symptoms. Current symptoms include sharp abdominal pain when hungry and chronic diarrhea. - Refer to Dr. Kristie, gastroenterologist, for further  evaluation  Generalized anxiety disorder and depression Generalized anxiety disorder and depression. Currently taking Klonopin as needed for anxiety. Concerns about long-term use of Klonopin due to potential for addiction and cognitive effects. Not currently under psychiatric care. - Refer to psychiatry for evaluation and management of anxiety and depression - Discuss potential tapering of Klonopin with psychiatric support  Obesity, unspecified, on semaglutide therapy Obesity management with semaglutide therapy since April. She has lost 40 pounds but experiences nausea with higher doses. Currently self-adjusting dose to manage side effects. Continues to lose weight on a lower dose. - Continue current semaglutide regimen with adjusted dosing to manage nausea  Nicotine dependence, in remission Nicotine dependence in remission. She quit vaping on September 2nd and is managing withdrawal symptoms with increased soda consumption. - Encourage continued abstinence from nicotine - Advise reducing soda intake to improve overall health  Birth control  -Stable on Aurovela Fe, refill as needed      Return in about 1 year (around 06/16/2025) for recheck/follow-up.    Subjective:    Chief Complaint  Patient presents with   Nausea    Pt in office for annual CPE, changes to health include frequent nausea that cause chest tightness on left side and left arm goes numb; pt states happens daily but pain associated with nausea started last week, noticed happens when eating, pt is on Semaglutide but not taken in the past few weeks.     HPI Discussed the use of AI scribe software for clinical note transcription with the patient, who gave verbal consent to proceed.  History of Present  Illness Morgan Marshall is a 23 year old female who presents with nausea, vomiting, dizziness, and chest tightness. Here with her mom.   She has been experiencing severe nausea and vomiting since February, with symptoms  worsening over time. Episodes of nausea occur suddenly, leading to vomiting, and are not consistently linked to her semaglutide injections. She has tried stopping the injections, but the nausea persists. Daily nausea is present, with vomiting occurring less frequently. She has a history of gallbladder removal, which initially provided some relief, but symptoms have returned. She also has high cholesterol and was recently hospitalized for constipation related to Zofran  use. She has been taking semaglutide since April, initially at 35 units but reduced to 20 units due to nausea. She also takes Klonopin daily for anxiety and occasionally uses probiotics.  She experiences episodes of dizziness and feeling faint, particularly when changing positions, such as standing up quickly or bending down. These episodes are accompanied by blurred vision and a sensation of passing out.  She reports episodes of chest tightness, particularly on the left side, which radiates to her arm and neck, causing numbness down to her fingertips. These symptoms are often accompanied by a rapid heart rate, reaching up to 130 beats per minute.  She has attempted dietary changes, including reducing fatty meats and gluten, but continues to experience symptoms. She reports a sharp pain in her upper abdomen when her stomach growls, which started after her gallbladder surgery. She has a history of being told she has a celiac marker but has not consistently followed a gluten-free diet.  Her social history includes being a Consulting civil engineer, recently purchasing a house, and quitting vaping on September 2nd. She drinks more soda than water , which she increased to cope with nicotine withdrawal. She is not currently working due to her busy schedule with school and moving.     Past Medical History:  Diagnosis Date   ADD (attention deficit disorder)    Asthma    Chicken pox    GERD (gastroesophageal reflux disease)    Jaundice    Urinary tract  infection     Past Surgical History:  Procedure Laterality Date   CHOLECYSTECTOMY N/A 05/10/2022   Procedure: LAPAROSCOPIC CHOLECYSTECTOMY WITH INTRAOPERATIVE CHOLANGIOGRAM;  Surgeon: Sheldon Standing, MD;  Location: [facility];  Service: General;  Laterality: N/A;   ESOPHAGOGASTRODUODENOSCOPY N/A 05/07/2022   Procedure: ESOPHAGOGASTRODUODENOSCOPY (EGD);  Surgeon: Kristie Lamprey, MD;  Location: [facility];  Service: Gastroenterology;  Laterality: N/A;  epigastric pain, nausea and vomiting   TYMPANOSTOMY TUBE PLACEMENT      Family History  Problem Relation Age of Onset   Heart attack Mother    Hyperlipidemia Father    Asthma Brother    Arthritis Maternal Grandmother    Asthma Maternal Grandmother    Hyperlipidemia Maternal Grandmother    Hyperlipidemia Maternal Grandfather    Hyperlipidemia Paternal Grandmother    Early death Paternal Grandfather     Social History   Tobacco Use   Smoking status: Never   Smokeless tobacco: Never  Vaping Use   Vaping status: Never Used  Substance Use Topics   Alcohol use: Not Currently   Drug use: Not Currently     Allergies  Allergen Reactions   Latex Hives    Review of Systems NEGATIVE UNLESS OTHERWISE INDICATED IN HPI      Objective:     BP 104/66 (BP Location: Left Arm, Patient Position: Sitting, Cuff Size: Normal)   Pulse 82   Temp 98.2 F (36.8 C) (  Temporal)   Ht 5' 8 (1.727 m)   Wt 207 lb 3.2 oz (94 kg)   LMP 06/02/2024 (Exact Date)   SpO2 95%   BMI 31.50 kg/m   Wt Readings from Last 3 Encounters:  06/16/24 207 lb 3.2 oz (94 kg)  04/05/24 220 lb 6.4 oz (100 kg)  03/02/23 229 lb (103.9 kg)    BP Readings from Last 3 Encounters:  06/16/24 104/66  04/05/24 98/72  02/04/24 138/68     Physical Exam Vitals and nursing note reviewed.  Constitutional:      Appearance: Normal appearance. She is obese.  Eyes:     Extraocular Movements: Extraocular movements intact.     Conjunctiva/sclera: Conjunctivae normal.      Pupils: Pupils are equal, round, and reactive to light.  Cardiovascular:     Rate and Rhythm: Normal rate and regular rhythm.     Pulses: Normal pulses.  Pulmonary:     Effort: Pulmonary effort is normal.     Breath sounds: Normal breath sounds.  Abdominal:     General: There is no distension.     Palpations: There is no mass.     Tenderness: There is abdominal tenderness (epigastric, mild). There is no right CVA tenderness, left CVA tenderness or guarding.     Hernia: No hernia is present.  Musculoskeletal:     Right lower leg: No edema.     Left lower leg: No edema.  Skin:    Findings: No rash.  Neurological:     Mental Status: She is alert.  Psychiatric:        Mood and Affect: Mood normal.             Shandi Godfrey M Wavie Hashimi, PA-C

## 2024-06-17 ENCOUNTER — Ambulatory Visit: Payer: Self-pay | Admitting: Physician Assistant

## 2024-06-17 LAB — H. PYLORI BREATH TEST: H. pylori Breath Test: NOT DETECTED

## 2024-06-18 LAB — CELIAC DISEASE AB SCREEN W/RFX
Antigliadin Abs, IgA: 1 U (ref 0–19)
IgA/Immunoglobulin A, Serum: 124 mg/dL (ref 87–352)
Transglutaminase IgA: <2 U/mL (ref 0–3)

## 2024-06-22 ENCOUNTER — Telehealth: Payer: Self-pay

## 2024-06-22 NOTE — Telephone Encounter (Signed)
 Copied from CRM [id number]. Topic: General - Other >> Jun 22, 2024 11:40 AM [place] A wrote: Reason for CRM: [facility]  called said they are not able to see the patient per the referral  Returned office call spoke with [facility] and was advised Dr Kristie states she is not able to see the patient, no reason given other than not able to see this patient. Please advise if referral needs to be sent to another office.

## 2024-07-06 ENCOUNTER — Other Ambulatory Visit: Payer: Self-pay

## 2024-07-06 DIAGNOSIS — R1013 Epigastric pain: Secondary | ICD-10-CM

## 2024-07-06 DIAGNOSIS — R110 Nausea: Secondary | ICD-10-CM

## 2024-07-18 ENCOUNTER — Encounter: Payer: Self-pay | Admitting: Radiology

## 2024-07-29 ENCOUNTER — Ambulatory Visit (HOSPITAL_BASED_OUTPATIENT_CLINIC_OR_DEPARTMENT_OTHER): Payer: Self-pay | Admitting: Certified Nurse Midwife

## 2024-07-29 ENCOUNTER — Encounter (HOSPITAL_BASED_OUTPATIENT_CLINIC_OR_DEPARTMENT_OTHER): Payer: Self-pay | Admitting: Certified Nurse Midwife

## 2024-07-29 ENCOUNTER — Other Ambulatory Visit (HOSPITAL_COMMUNITY)
Admission: RE | Admit: 2024-07-29 | Discharge: 2024-07-29 | Disposition: A | Discharge status: Home / Self Care | Source: Ambulatory Visit | Attending: Certified Nurse Midwife | Admitting: Certified Nurse Midwife

## 2024-07-29 VITALS — BP 114/78 | HR 80 | Ht 67.0 in | Wt 207.6 lb

## 2024-07-29 DIAGNOSIS — Z1331 Encounter for screening for depression: Secondary | ICD-10-CM

## 2024-07-29 DIAGNOSIS — Z124 Encounter for screening for malignant neoplasm of cervix: Secondary | ICD-10-CM | POA: Diagnosis present

## 2024-07-29 DIAGNOSIS — Z01419 Encounter for gynecological examination (general) (routine) without abnormal findings: Secondary | ICD-10-CM

## 2024-07-29 DIAGNOSIS — Z1151 Encounter for screening for human papillomavirus (HPV): Secondary | ICD-10-CM

## 2024-07-29 DIAGNOSIS — Z3041 Encounter for surveillance of contraceptive pills: Secondary | ICD-10-CM | POA: Insufficient documentation

## 2024-07-29 DIAGNOSIS — Z113 Encounter for screening for infections with a predominantly sexual mode of transmission: Secondary | ICD-10-CM | POA: Insufficient documentation

## 2024-07-29 DIAGNOSIS — Z3009 Encounter for other general counseling and advice on contraception: Secondary | ICD-10-CM

## 2024-07-29 MED ORDER — NORETHIN ACE-ETH ESTRAD-FE 1-20 MG-MCG PO TABS: 1.0000 | ORAL_TABLET | Freq: Every day | ORAL | 5 refills | Status: DC

## 2024-07-29 NOTE — Progress Notes (Signed)
 ANNUAL EXAM Patient name: Morgan Marshall MRN [id number]  Date of birth: June 18, 2001 Chief Complaint:   Annual Exam and Contraception  History of Present Illness:   Morgan Marshall is a 23 y.o. G0P0000 Caucasian female being seen today for a routine annual exam. She is on Junel  for contraception and is very satisfied with this method of birth control (and Junel ) and would like to continue. She is sexually active and monogamous. Works full time at a [employer], product/process development. Has 2 rescued dogs. Just recently bought her own home. Doing well emotionally. Periods are monthly. Gardasil vaccine series complete. No prior pap smears.   Current complaints: None  No LMP recorded (lmp unknown). LMP 3 weeks ago.   Upstream - 07/29/24 9167       Pregnancy Intention Screening   Does the patient want to become pregnant in the next year? No    Does the patient's partner want to become pregnant in the next year? No    Would the patient like to discuss contraceptive options today? Yes      Contraception Wrap Up   Current Method Oral Contraceptive         She would like to bear children in the future.   Last pap: Has not had one thus far due to age Last mammogram: N/A.  Family h/o breast cancer: no Last colonoscopy: N/A. Family h/o colorectal cancer: no HPV Vaccine: Completed (age 55 x 2 doses)     07/29/2024    8:31 AM 04/05/2024    2:53 PM 01/23/2023   10:00 AM 12/23/2021    8:49 AM 12/31/2020    2:00 PM  Depression screen PHQ 2/9  Decreased Interest 0 0 0 0 0  Down, Depressed, Hopeless 0 0 2 1 0  PHQ - 2 Score 0 0 2 1 0  Altered sleeping  1 3 2    Tired, decreased energy  0 0 2   Change in appetite  0 1 2   Feeling bad or failure about yourself   0 1 0   Trouble concentrating  0 3 0   Moving slowly or fidgety/restless  0 3 1   Suicidal thoughts  0 0 0   PHQ-9 Score  1  13  8     Difficult doing work/chores  Not difficult at all Somewhat difficult Very difficult      Data saved with a previous  flowsheet row definition        07/29/2024    8:31 AM 04/05/2024    2:54 PM 01/23/2023   10:01 AM 12/23/2021    8:48 AM  GAD 7 : Generalized Anxiety Score  Nervous, Anxious, on Edge 1 1 1 2   Control/stop worrying 0 1 0 2  Worry too much - different things 0 1 3 2   Trouble relaxing 0 1 2 1   Restless 0 1 0 0  Easily annoyed or irritable 1 1 3 2   Afraid - awful might happen 0 1 0 3  Total GAD 7 Score 2 7 9 12   Anxiety Difficulty Not difficult at all Not difficult at all Somewhat difficult Extremely difficult     Review of Systems:   Pertinent items are noted in HPI Denies any headaches, blurred vision, fatigue, shortness of breath, chest pain, abdominal pain, abnormal vaginal discharge/itching/odor/irritation, problems with periods, bowel movements, urination, or intercourse unless otherwise stated above. Pertinent History Reviewed:  Reviewed past medical,surgical, social and family history.  Reviewed problem list, medications and allergies. Physical Assessment:  Vitals:   07/29/24 0833  BP: 114/78  Pulse: 80  SpO2: 100%  Weight: 207 lb 9.6 oz (94.2 kg)  Height: 5' 7 (1.702 m)  Body mass index is 32.51 kg/m.        Physical Examination:   General appearance - well appearing, and in no distress  Mental status - alert, oriented to person, place, and time  Psych:  She has a normal mood and affect  Skin - warm and dry, normal color, no suspicious lesions noted  Chest - effort normal, all lung fields clear to auscultation bilaterally  Heart - normal rate and regular rhythm  Neck:  midline trachea, no thyromegaly or nodules  Breasts - breasts appear normal, no suspicious masses, no skin or nipple changes or  axillary nodes  Abdomen - soft, nontender, nondistended, no masses or organomegaly  Pelvic - VULVA: normal appearing vulva with no masses, tenderness or lesions  VAGINA: normal appearing vagina with normal color and discharge, no lesions  CERVIX: normal appearing cervix  without discharge or lesions, no CMT  Thin prep pap is done with  HR HPV cotesting and GC/CT due to age <26 years   UTERUS: uterus is felt to be normal size, shape, consistency and nontender   ADNEXA: No adnexal masses or tenderness noted.  Extremities:  No swelling or varicosities noted  Chaperone present for exam  No results found for this or any previous visit (from the past 24 hours).  Assessment & Plan:   1) Well-Woman Exam - Breast self exams encouraged - Gardasil vaccine series complete - Depression and Domestic Violence Screen Negative - Healthy diet w/ fruits/vegetables encouraged. - Pt praised for tobacco cessation - Flu Vaccine offered (declined) - Routine pap smear for cervical cancer screening collected with routine GC/CT   2) Surveillance Combined Oral Contraceptives - Pt would like to continue Junel  Monthly regimen  Labs/procedures today: Pap Smear  RTO 1 year for annual gyn exam and prn if issues arise. Arland MARLA Roller

## 2024-08-02 ENCOUNTER — Ambulatory Visit (HOSPITAL_BASED_OUTPATIENT_CLINIC_OR_DEPARTMENT_OTHER): Payer: Self-pay | Admitting: Certified Nurse Midwife

## 2024-08-02 DIAGNOSIS — A749 Chlamydial infection, unspecified: Secondary | ICD-10-CM

## 2024-08-02 DIAGNOSIS — B977 Papillomavirus as the cause of diseases classified elsewhere: Secondary | ICD-10-CM

## 2024-08-02 LAB — CYTOLOGY - PAP
Adequacy: ABSENT
Chlamydia: POSITIVE — AB
Comment: NEGATIVE
Comment: NEGATIVE
Comment: NEGATIVE
Comment: NEGATIVE
Comment: NEGATIVE
Comment: NORMAL
Diagnosis: NEGATIVE
HPV 16: NEGATIVE
HPV 18 / 45: NEGATIVE
High risk HPV: POSITIVE — AB
Neisseria Gonorrhea: NEGATIVE
Trichomonas: NEGATIVE

## 2024-08-03 ENCOUNTER — Other Ambulatory Visit (HOSPITAL_BASED_OUTPATIENT_CLINIC_OR_DEPARTMENT_OTHER): Payer: Self-pay

## 2024-08-03 DIAGNOSIS — Z3009 Encounter for other general counseling and advice on contraception: Secondary | ICD-10-CM

## 2024-08-03 MED ORDER — NORETHIN ACE-ETH ESTRAD-FE 1.5-30 MG-MCG PO TABS: 1.0000 | ORAL_TABLET | Freq: Every day | ORAL | 3 refills | Status: AC

## 2024-08-03 MED ORDER — DOXYCYCLINE HYCLATE 100 MG PO CAPS: 100.0000 mg | ORAL_CAPSULE | Freq: Two times a day (BID) | ORAL | 0 refills | Status: DC

## 2024-08-05 ENCOUNTER — Ambulatory Visit (HOSPITAL_BASED_OUTPATIENT_CLINIC_OR_DEPARTMENT_OTHER)

## 2024-08-05 VITALS — BP 108/68 | HR 66 | Ht 67.0 in | Wt 210.0 lb

## 2024-08-05 DIAGNOSIS — Z23 Encounter for immunization: Secondary | ICD-10-CM

## 2024-08-05 NOTE — Progress Notes (Unsigned)
 NURSE VISIT- INJECTION  SUBJECTIVE:  Morgan Marshall is a 23 y.o. G0P0000 female here for a Gardasil for per provider order. She is a GYN patient.   OBJECTIVE:  LMP  (LMP Unknown)   Appears well, in no apparent distress  Injection administered in: Right deltoid  No orders of the defined types were placed in this encounter.   ASSESSMENT: GYN patient Gardasil for per provider order PLAN: Follow-up: as scheduled

## 2024-08-07 ENCOUNTER — Encounter: Payer: Self-pay | Admitting: Physician Assistant

## 2024-08-08 ENCOUNTER — Other Ambulatory Visit: Payer: Self-pay

## 2024-08-08 DIAGNOSIS — Z113 Encounter for screening for infections with a predominantly sexual mode of transmission: Secondary | ICD-10-CM

## 2024-08-08 NOTE — Telephone Encounter (Signed)
 Please see pt message and advise on any future orders needed.

## 2024-08-19 ENCOUNTER — Other Ambulatory Visit (HOSPITAL_COMMUNITY)
Admission: RE | Admit: 2024-08-19 | Discharge: 2024-08-19 | Disposition: A | Source: Ambulatory Visit | Attending: Physician Assistant | Admitting: Physician Assistant

## 2024-08-19 ENCOUNTER — Other Ambulatory Visit

## 2024-08-19 DIAGNOSIS — Z113 Encounter for screening for infections with a predominantly sexual mode of transmission: Secondary | ICD-10-CM

## 2024-08-22 LAB — URINE CYTOLOGY ANCILLARY ONLY
Chlamydia: NEGATIVE
Comment: NEGATIVE
Comment: NEGATIVE
Comment: NORMAL
Neisseria Gonorrhea: NEGATIVE
Trichomonas: NEGATIVE

## 2024-08-24 ENCOUNTER — Ambulatory Visit: Payer: Self-pay | Admitting: Physician Assistant

## 2024-09-16 ENCOUNTER — Telehealth: Admitting: Physician Assistant

## 2024-09-16 ENCOUNTER — Encounter

## 2024-09-16 DIAGNOSIS — J019 Acute sinusitis, unspecified: Secondary | ICD-10-CM

## 2024-09-16 DIAGNOSIS — B9689 Other specified bacterial agents as the cause of diseases classified elsewhere: Secondary | ICD-10-CM

## 2024-09-16 MED ORDER — PREDNISONE 20 MG PO TABS: 40.0000 mg | ORAL_TABLET | Freq: Every day | ORAL | 0 refills | Status: DC

## 2024-09-16 MED ORDER — PROMETHAZINE-DM 6.25-15 MG/5ML PO SYRP: 5.0000 mL | ORAL_SOLUTION | Freq: Four times a day (QID) | ORAL | 0 refills | Status: DC | PRN

## 2024-09-16 MED ORDER — ALBUTEROL SULFATE HFA 108 (90 BASE) MCG/ACT IN AERS: 1.0000 | INHALATION_SPRAY | Freq: Four times a day (QID) | RESPIRATORY_TRACT | 0 refills | Status: AC | PRN

## 2024-09-16 MED ORDER — AMOXICILLIN-POT CLAVULANATE 875-125 MG PO TABS: 1.0000 | ORAL_TABLET | Freq: Two times a day (BID) | ORAL | 0 refills | Status: AC

## 2024-09-16 NOTE — Progress Notes (Signed)
 " Virtual Visit Consent   Aaisha Sliter, you are scheduled for a virtual visit with a [facility] provider today. Just as with appointments in the office, your consent must be obtained to participate. Your consent will be active for this visit and any virtual visit you may have with one of our providers in the next 365 days. If you have a MyChart account, a copy of this consent can be sent to you electronically.  As this is a virtual visit, video technology does not allow for your provider to perform a traditional examination. This may limit your provider's ability to fully assess your condition. If your provider identifies any concerns that need to be evaluated in person or the need to arrange testing (such as labs, EKG, etc.), we will make arrangements to do so. Although advances in technology are sophisticated, we cannot ensure that it will always work on either your end or our end. If the connection with a video visit is poor, the visit may have to be switched to a telephone visit. With either a video or telephone visit, we are not always able to ensure that we have a secure connection.  By engaging in this virtual visit, you consent to the provision of healthcare and authorize for your insurance to be billed (if applicable) for the services provided during this visit. Depending on your insurance coverage, you may receive a charge related to this service.  I need to obtain your verbal consent now. Are you willing to proceed with your visit today? Francisca Bourcier has provided verbal consent on 09/16/2024 for a virtual visit (video or telephone). [name], PA-C  Date: 09/16/2024 6:41 PM   Virtual Visit via Video Note   IDelon CHRISTELLA Dickinson, connected with  Lachrista Heslin  ([id number], 07/28/2001) on 09/16/2024 at  6:30 PM EST by a video-enabled telemedicine application and verified that I am speaking with the correct person using two identifiers.  Location: Patient: Virtual Visit Location Patient:  Home Provider: Virtual Visit Location Provider: Home Office   I discussed the limitations of evaluation and management by telemedicine and the availability of in person appointments. The patient expressed understanding and agreed to proceed.    History of Present Illness: Mariabelen Pressly is a 24 y.o. who identifies as a female who was assigned female at birth, and is being seen today for URI symptoms.  HPI: Sinusitis This is a new problem. The current episode started 1 to 4 weeks ago. The problem has been gradually worsening since onset. There has been no fever. The pain is moderate. Associated symptoms include congestion, coughing, ear pain (L>R), headaches, a hoarse voice, sinus pressure and a sore throat (scratchy throat leading to hoarse voice). Pertinent negatives include no chills, diaphoresis or swollen glands. (Hurts to take a deep breath) Past treatments include saline nose sprays (advil cold and flu and advil cold and sinus, delsym, albuterol , humidifer). The treatment provided no relief.     Problems:  Patient Active Problem List   Diagnosis Date Noted   Oral contraceptive pill surveillance 07/29/2024   Rash and other nonspecific skin eruption 11/17/2022   Mild intermittent reactive airway disease 11/17/2022   Other allergic rhinitis 11/17/2022   Food intolerance 11/17/2022   Latex allergy , contact dermatitis 11/17/2022   Pain in left foot 09/16/2022   Abdominal pain 05/08/2022   Epigastric pain 05/06/2022   Asthma exacerbation, mild 07/18/2021   Acute mucoid otitis media of left ear 07/18/2021   GAD (generalized anxiety disorder)  12/31/2020   Nail avulsion, finger, initial encounter 08/24/2019   Irregular periods 02/17/2018   Asthma 11/17/2017   Childhood obesity 11/17/2017    Allergies: Allergies[1] Medications: Current Medications[2]  Observations/Objective: Patient is well-developed, well-nourished in no acute distress.  Resting comfortably at home.  Head is  normocephalic, atraumatic.  No labored breathing.  Speech is clear and coherent with logical content.  Patient is alert and oriented at baseline.   Assessment and Plan: 1. Acute bacterial sinusitis (Primary) - amoxicillin -clavulanate (AUGMENTIN ) 875-125 MG tablet; Take 1 tablet by mouth 2 (two) times daily.  Dispense: 20 tablet; Refill: 0 - predniSONE  (DELTASONE ) 20 MG tablet; Take 2 tablets (40 mg total) by mouth daily with breakfast.  Dispense: 10 tablet; Refill: 0 - albuterol  (VENTOLIN  HFA) 108 (90 Base) MCG/ACT inhaler; Inhale 1-2 puffs into the lungs every 6 (six) hours as needed.  Dispense: 8 g; Refill: 0 - promethazine -dextromethorphan (PROMETHAZINE -DM) 6.25-15 MG/5ML syrup; Take 5 mLs by mouth 4 (four) times daily as needed.  Dispense: 118 mL; Refill: 0  - Worsening over a week despite OTC medications - Will treat with Augmentin  and Prednisone  - Add Promethazine  DM for cough - Refilled Albuterol  inhaler - Can add Mucinex  (PLAIN) during the daytime if needed - Push fluids.  - Rest.  - Steam and humidifier can help - Seek in person evaluation if worsening or symptoms fail to improve    Follow Up Instructions: I discussed the assessment and treatment plan with the patient. The patient was provided an opportunity to ask questions and all were answered. The patient agreed with the plan and demonstrated an understanding of the instructions.  A copy of instructions were sent to the patient via MyChart unless otherwise noted below.    The patient was advised to call back or seek an in-person evaluation if the symptoms worsen or if the condition fails to improve as anticipated.    Jiles Goya M Dannel Rafter, PA-C     [1]  Allergies Allergen Reactions   Latex Hives  [2]  Current Outpatient Medications:    albuterol  (VENTOLIN  HFA) 108 (90 Base) MCG/ACT inhaler, Inhale 1-2 puffs into the lungs every 6 (six) hours as needed., Disp: 8 g, Rfl: 0   amoxicillin -clavulanate (AUGMENTIN )  875-125 MG tablet, Take 1 tablet by mouth 2 (two) times daily., Disp: 20 tablet, Rfl: 0   predniSONE  (DELTASONE ) 20 MG tablet, Take 2 tablets (40 mg total) by mouth daily with breakfast., Disp: 10 tablet, Rfl: 0   promethazine -dextromethorphan (PROMETHAZINE -DM) 6.25-15 MG/5ML syrup, Take 5 mLs by mouth 4 (four) times daily as needed., Disp: 118 mL, Rfl: 0   doxycycline  (VIBRAMYCIN ) 100 MG capsule, Take 1 capsule (100 mg total) by mouth 2 (two) times daily. Take with food as can cause GI distress., Disp: 14 capsule, Rfl: 0   norethindrone-ethinyl estradiol-iron (AUROVELA FE 1.5/30) 1.5-30 MG-MCG tablet, Take 1 tablet by mouth daily., Disp: 90 tablet, Rfl: 3   Probiotic Product (PROBIOTIC DAILY PO), Take by mouth., Disp: , Rfl:   "

## 2024-09-16 NOTE — Patient Instructions (Signed)
 " Morgan Marshall, thank you for joining [name], PA-C for today's virtual visit.  While this provider is not your primary care provider (PCP), if your PCP is located in our provider database this encounter information will be shared with them immediately following your visit.   A [facility] MyChart account gives you access to today's visit and all your visits, tests, and labs performed at [facility]  click here if you don't have a [facility] MyChart account or go to [email or web address]  Consent: (Patient) Morgan Marshall provided verbal consent for this virtual visit at the beginning of the encounter.  Current Medications:  Current Outpatient Medications:    albuterol  (VENTOLIN  HFA) 108 (90 Base) MCG/ACT inhaler, Inhale 1-2 puffs into the lungs every 6 (six) hours as needed., Disp: 8 g, Rfl: 0   amoxicillin -clavulanate (AUGMENTIN ) 875-125 MG tablet, Take 1 tablet by mouth 2 (two) times daily., Disp: 20 tablet, Rfl: 0   predniSONE  (DELTASONE ) 20 MG tablet, Take 2 tablets (40 mg total) by mouth daily with breakfast., Disp: 10 tablet, Rfl: 0   promethazine -dextromethorphan (PROMETHAZINE -DM) 6.25-15 MG/5ML syrup, Take 5 mLs by mouth 4 (four) times daily as needed., Disp: 118 mL, Rfl: 0   doxycycline  (VIBRAMYCIN ) 100 MG capsule, Take 1 capsule (100 mg total) by mouth 2 (two) times daily. Take with food as can cause GI distress., Disp: 14 capsule, Rfl: 0   norethindrone-ethinyl estradiol-iron (AUROVELA FE 1.5/30) 1.5-30 MG-MCG tablet, Take 1 tablet by mouth daily., Disp: 90 tablet, Rfl: 3   Probiotic Product (PROBIOTIC DAILY PO), Take by mouth., Disp: , Rfl:    Medications ordered in this encounter:  Meds ordered this encounter  Medications   amoxicillin -clavulanate (AUGMENTIN ) 875-125 MG tablet    Sig: Take 1 tablet by mouth 2 (two) times daily.    Dispense:  20 tablet    Refill:  0    Supervising Provider:   LAMPTEY, PHILIP O [[id number]]   predniSONE  (DELTASONE ) 20 MG  tablet    Sig: Take 2 tablets (40 mg total) by mouth daily with breakfast.    Dispense:  10 tablet    Refill:  0    Supervising Provider:   LAMPTEY, PHILIP O [[id number]]   albuterol  (VENTOLIN  HFA) 108 (90 Base) MCG/ACT inhaler    Sig: Inhale 1-2 puffs into the lungs every 6 (six) hours as needed.    Dispense:  8 g    Refill:  0    Supervising Provider:   BLAISE ALEENE KIDD [[id number]]   promethazine -dextromethorphan (PROMETHAZINE -DM) 6.25-15 MG/5ML syrup    Sig: Take 5 mLs by mouth 4 (four) times daily as needed.    Dispense:  118 mL    Refill:  0    Supervising Provider:   BLAISE ALEENE KIDD [[id number]]     *If you need refills on other medications prior to your next appointment, please contact your pharmacy*  Follow-Up: Call back or seek an in-person evaluation if the symptoms worsen or if the condition fails to improve as anticipated.  [facility] [phone]  Other Instructions Sinus Infection, Adult A sinus infection, also called sinusitis, is inflammation of your sinuses. Sinuses are hollow spaces in the bones around your face. Your sinuses are located: Around your eyes. In the middle of your forehead. Behind your nose. In your cheekbones. Mucus normally drains out of your sinuses. When your nasal tissues become inflamed or swollen, mucus can become trapped or blocked. This allows bacteria, viruses, and fungi to grow, which  leads to infection. Most infections of the sinuses are caused by a virus. A sinus infection can develop quickly. It can last for up to 4 weeks (acute) or for more than 12 weeks (chronic). A sinus infection often develops after a cold. What are the causes? This condition is caused by anything that creates swelling in the sinuses or stops mucus from draining. This includes: Allergies. Asthma. Infection from bacteria or viruses. Deformities or blockages in your nose or sinuses. Abnormal growths in the nose (nasal polyps). Pollutants, such as  chemicals or irritants in the air. Infection from fungi. This is rare. What increases the risk? You are more likely to develop this condition if you: Have a weak body defense system (immune system). Do a lot of swimming or diving. Overuse nasal sprays. Smoke. What are the signs or symptoms? The main symptoms of this condition are pain and a feeling of pressure around the affected sinuses. Other symptoms include: Stuffy nose or congestion that makes it difficult to breathe through your nose. Thick yellow or greenish drainage from your nose. Tenderness, swelling, and warmth over the affected sinuses. A cough that may get worse at night. Decreased sense of smell and taste. Extra mucus that collects in the throat or the back of the nose (postnasal drip) causing a sore throat or bad breath. Tiredness (fatigue). Fever. How is this diagnosed? This condition is diagnosed based on: Your symptoms. Your medical history. A physical exam. Tests to find out if your condition is acute or chronic. This may include: Checking your nose for nasal polyps. Viewing your sinuses using a device that has a light (endoscope). Testing for allergies or bacteria. Imaging tests, such as an MRI or CT scan. In rare cases, a bone biopsy may be done to rule out more serious types of fungal sinus disease. How is this treated? Treatment for a sinus infection depends on the cause and whether your condition is chronic or acute. If caused by a virus, your symptoms should go away on their own within 10 days. You may be given medicines to relieve symptoms. They include: Medicines that shrink swollen nasal passages (decongestants). A spray that eases inflammation of the nostrils (topical intranasal corticosteroids). Rinses that help get rid of thick mucus in your nose (nasal saline washes). Medicines that treat allergies (antihistamines). Over-the-counter pain relievers. If caused by bacteria, your health care provider  may recommend waiting to see if your symptoms improve. Most bacterial infections will get better without antibiotic medicine. You may be given antibiotics if you have: A severe infection. A weak immune system. If caused by narrow nasal passages or nasal polyps, surgery may be needed. Follow these instructions at home: Medicines Take, use, or apply over-the-counter and prescription medicines only as told by your health care provider. These may include nasal sprays. If you were prescribed an antibiotic medicine, take it as told by your health care provider. Do not stop taking the antibiotic even if you start to feel better. Hydrate and humidify  Drink enough fluid to keep your urine pale yellow. Staying hydrated will help to thin your mucus. Use a cool mist humidifier to keep the humidity level in your home above 50%. Inhale steam for 10-15 minutes, 3-4 times a day, or as told by your health care provider. You can do this in the bathroom while a hot shower is running. Limit your exposure to cool or dry air. Rest Rest as much as possible. Sleep with your head raised (elevated). Make sure you  get enough sleep each night. General instructions  Apply a warm, moist washcloth to your face 3-4 times a day or as told by your health care provider. This will help with discomfort. Use nasal saline washes as often as told by your health care provider. Wash your hands often with soap and water  to reduce your exposure to germs. If soap and water  are not available, use hand sanitizer. Do not smoke. Avoid being around people who are smoking (secondhand smoke). Keep all follow-up visits. This is important. Contact a health care provider if: You have a fever. Your symptoms get worse. Your symptoms do not improve within 10 days. Get help right away if: You have a severe headache. You have persistent vomiting. You have severe pain or swelling around your face or eyes. You have vision problems. You  develop confusion. Your neck is stiff. You have trouble breathing. These symptoms may be an emergency. Get help right away. Call 911. Do not wait to see if the symptoms will go away. Do not drive yourself to the hospital. Summary A sinus infection is soreness and inflammation of your sinuses. Sinuses are hollow spaces in the bones around your face. This condition is caused by nasal tissues that become inflamed or swollen. The swelling traps or blocks the flow of mucus. This allows bacteria, viruses, and fungi to grow, which leads to infection. If you were prescribed an antibiotic medicine, take it as told by your health care provider. Do not stop taking the antibiotic even if you start to feel better. Keep all follow-up visits. This is important. This information is not intended to replace advice given to you by your health care provider. Make sure you discuss any questions you have with your health care provider. Document Revised: 08/06/2021 Document Reviewed: 08/06/2021 Elsevier Patient Education  2024 Elsevier Inc.   If you have been instructed to have an in-person evaluation today at a local Urgent Care facility, please use the link below. It will take you to a list of all of our available [facility], including address, phone number and hours of operation. Please do not delay care.  [facility]  If you or a family member do not have a primary care provider, use the link below to schedule a visit and establish care. When you choose a [facility] primary care physician or advanced practice provider, you gain a long-term partner in health. Find a Primary Care Provider  Learn more about [facility]'s in-office and virtual care options: [facility] - Get Care Now "

## 2024-09-19 ENCOUNTER — Encounter: Payer: Self-pay | Admitting: Physician Assistant

## 2024-09-19 ENCOUNTER — Telehealth: Payer: Self-pay

## 2024-09-19 NOTE — Telephone Encounter (Signed)
 Noted and agreed with plan, thank you.

## 2024-09-19 NOTE — Telephone Encounter (Signed)
 Copied from CRM [id number]. Topic: Clinical - Medical Advice >> Sep 19, 2024 12:16 PM Alexandria E wrote: Reason for CRM: Patient was bit by a cat today 1/5 at work, got patient scheduled for a follow up with Rosina Senters tomorrow 1/6, as she was seen in UC 1/5. Mom is worried as the cat was overdue for a rabies vaccine since February of 2024, please call mom if patient will need a rabies vaccine.

## 2024-09-20 ENCOUNTER — Encounter: Payer: Self-pay | Admitting: Internal Medicine

## 2024-09-20 ENCOUNTER — Ambulatory Visit (INDEPENDENT_AMBULATORY_CARE_PROVIDER_SITE_OTHER): Admitting: Internal Medicine

## 2024-09-20 VITALS — BP 110/80 | HR 89 | Temp 97.8°F | Ht 67.0 in | Wt 212.0 lb

## 2024-09-20 DIAGNOSIS — S51851A Open bite of right forearm, initial encounter: Secondary | ICD-10-CM | POA: Diagnosis not present

## 2024-09-20 DIAGNOSIS — W5501XA Bitten by cat, initial encounter: Secondary | ICD-10-CM

## 2024-09-20 DIAGNOSIS — W5501XD Bitten by cat, subsequent encounter: Secondary | ICD-10-CM

## 2024-09-20 NOTE — Patient Instructions (Signed)
 Continue Augmentin  2 times a day for 10 days total.  Continue mupirocin  ointment 2 times a day  Keep area covered at work - can do open to air at home / night time  Continue rabies vaccination series as scheduled   Ibuprofen and tylenol  as needed for pain

## 2024-09-20 NOTE — Telephone Encounter (Signed)
"  See encounter notes.  "

## 2024-09-20 NOTE — Progress Notes (Signed)
 " [facility]IMARY [facility]ARE [facility] [address] [place] [address] Dept: [phone] Dept Fax: [phone]  Acute Care Office Visit  Subjective:   Morgan Marshall Sep 12, 2001 09/20/2024  Chief Complaint  Patient presents with   Follow-up    From cat bite 4 shot yesterday rabies, just follow for infection     HPI:  Discussed the use of AI scribe software for clinical note transcription with the patient, who gave verbal consent to proceed.  History of Present Illness   Morgan Marshall is a 24 year old female who presents for a follow-up after a cat bite.  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 Initially, the area was very red but has subsided per report of patient.   She was seen at a local urgent care on January 5th, 2026, where she received 1 gram of Rocephin  and 800 mg of ibuprofen. She was advised to continue Augmentin  875-125mg  to be taken twice daily x 10 days. No fever is present. She has been taking Augmentin  for a prior illness, which she started three days before the cat bite, and will continue for ten days. She was also given medication for a potential yeast infection due to the antibiotics.  She has been soaking the wound two to three times a day with warm water  and chlorhexidine wash, which causes burning. She applies mupirocin  ointment to the wound and keeps it covered with dressings provided by urgent care.   She started a rabies vaccination series yesterday due to the unknown vaccination status of the cat, receiving four rabies shots, and updated tetanus shot, She experiences soreness from the rabies and tetanus shots.     The following portions of the patient's history were reviewed and updated as appropriate: past medical history, past surgical history, family history, social history, allergies, medications, and problem list.   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   Pain in left foot 09/16/2022   Abdominal pain 05/08/2022   Epigastric pain 05/06/2022   Asthma exacerbation, mild 07/18/2021   Acute mucoid otitis media of left ear 07/18/2021   GAD (generalized anxiety disorder) 12/31/2020   Nail avulsion, finger, initial encounter 08/24/2019   Irregular periods 02/17/2018   Asthma 11/17/2017   Childhood obesity 11/17/2017   Past Medical History:  Diagnosis Date   ADD (attention deficit disorder)    Asthma    Chicken pox    GERD (gastroesophageal reflux disease)    Jaundice    Urinary tract infection    Past Surgical History:  Procedure Laterality Date   CHOLECYSTECTOMY N/A 05/10/2022   Procedure: LAPAROSCOPIC CHOLECYSTECTOMY WITH INTRAOPERATIVE CHOLANGIOGRAM;  Surgeon: Sheldon Standing, MD;  Location: [facility];  Service: General;  Laterality: N/A;   ESOPHAGOGASTRODUODENOSCOPY N/A 05/07/2022   Procedure: ESOPHAGOGASTRODUODENOSCOPY (EGD);  Surgeon: Kristie Lamprey, MD;  Location: [facility];  Service: Gastroenterology;  Laterality: N/A;  epigastric pain, nausea and vomiting   TYMPANOSTOMY TUBE PLACEMENT     Family History  Problem Relation Age of Onset   Heart attack Mother    Hyperlipidemia Father    Asthma Brother    Arthritis Maternal Grandmother    Asthma  Maternal Grandmother    Hyperlipidemia Maternal Grandmother    Hyperlipidemia Maternal Grandfather    Hyperlipidemia Paternal Grandmother    Early death Paternal Grandfather    Current Medications[1] Allergies[2]   ROS: A complete ROS was performed with pertinent positives/negatives noted in the HPI. The remainder of the ROS are negative.    Objective:   Today's Vitals    09/20/24 1048  BP: 110/80  Pulse: 89  Temp: 97.8 F (36.6 C)  TempSrc: Temporal  SpO2: 98%  Weight: 212 lb (96.2 kg)  Height: 5' 7 (1.702 m)    GENERAL: Well-appearing, in NAD. Well nourished.  SKIN: Pink, warm and dry. Multiple superficial puncture wounds to R. Forearm. No warmth, redness, or purulent discharge. (See media tab for picture) RESPIRATORY: Chest wall symmetrical. Respirations even and non-labored. CARDIAC:  Peripheral pulses 2+ bilaterally.  PSYCH/MENTAL STATUS: Alert, oriented x 3. Cooperative, appropriate mood and affect.    No results found for any visits on 09/20/24.    Assessment & Plan:  Assessment and Plan    Cat bite, subsequent encounter - Continue Augmentin  875-125mg  BID for 10 days. - Continue mupirocin  ointment twice daily. - Keep wound covered when at work or out in public, can leave open to air at home  - Monitor for signs of infection: increased redness, swelling, heat, purulent discharge. - Rewrapped wound with nonadherent dressing and ace wrap. Applied bacitracin ointment prior to discharge today - Take ibuprofen 800 mg up to three times daily as needed for pain, with food. - Continue rabies vaccination series as scheduled.       No orders of the defined types were placed in this encounter.  No orders of the defined types were placed in this encounter.  Lab Orders  No laboratory test(s) ordered today   No images are attached to the encounter or orders placed in the encounter.  Return if symptoms worsen or fail to improve.   Rosina Senters, FNP     [1]  Current Outpatient Medications:    albuterol  (VENTOLIN  HFA) 108 (90 Base) MCG/ACT inhaler, Inhale 1-2 puffs into the lungs every 6 (six) hours as needed., Disp: 8 g, Rfl: 0   amoxicillin -clavulanate (AUGMENTIN ) 875-125 MG tablet, Take 1 tablet by mouth 2 (two) times daily., Disp: 20 tablet, Rfl: 0   ibuprofen (ADVIL) 800 MG tablet, Take 800 mg by mouth 3 (three) times daily., Disp: ,  Rfl:    mupirocin  ointment (BACTROBAN ) 2 %, Apply topically., Disp: , Rfl:    norethindrone-ethinyl estradiol-iron (AUROVELA FE 1.5/30) 1.5-30 MG-MCG tablet, Take 1 tablet by mouth daily., Disp: 90 tablet, Rfl: 3   Probiotic Product (PROBIOTIC DAILY PO), Take by mouth., Disp: , Rfl:  [2]  Allergies Allergen Reactions   Latex Hives   "

## 2024-09-21 NOTE — Telephone Encounter (Signed)
 Copied from CRM [id number]. Topic: General - Other >> Sep 21, 2024  7:45 AM Victoria A wrote: Reason for CRM:  Patient called and needs a doctors note for service on 09/20/24 -please contact patient via Mychart or phone  [phone]

## 2024-10-16 ENCOUNTER — Encounter: Payer: Self-pay | Admitting: Physician Assistant

## 2024-10-16 ENCOUNTER — Telehealth: Admitting: Family Medicine

## 2024-10-16 DIAGNOSIS — H6691 Otitis media, unspecified, right ear: Secondary | ICD-10-CM | POA: Diagnosis not present

## 2024-10-16 DIAGNOSIS — J45901 Unspecified asthma with (acute) exacerbation: Secondary | ICD-10-CM

## 2024-10-16 DIAGNOSIS — Z20828 Contact with and (suspected) exposure to other viral communicable diseases: Secondary | ICD-10-CM | POA: Diagnosis not present

## 2024-10-16 DIAGNOSIS — J069 Acute upper respiratory infection, unspecified: Secondary | ICD-10-CM | POA: Diagnosis not present

## 2024-10-16 MED ORDER — PREDNISONE 20 MG PO TABS: 20.0000 mg | ORAL_TABLET | Freq: Two times a day (BID) | ORAL | 0 refills | Status: AC

## 2024-10-16 MED ORDER — CEFDINIR 300 MG PO CAPS: 300.0000 mg | ORAL_CAPSULE | Freq: Two times a day (BID) | ORAL | 0 refills | Status: AC

## 2024-10-16 MED ORDER — BENZONATATE 200 MG PO CAPS: 200.0000 mg | ORAL_CAPSULE | Freq: Three times a day (TID) | ORAL | 0 refills | Status: AC | PRN

## 2024-10-16 MED ORDER — IPRATROPIUM-ALBUTEROL 0.5-2.5 (3) MG/3ML IN SOLN: 3.0000 mL | Freq: Four times a day (QID) | RESPIRATORY_TRACT | 0 refills | Status: AC | PRN

## 2024-10-16 NOTE — Progress Notes (Signed)
 " Virtual Visit Consent   Morgan Marshall, you are scheduled for a virtual visit with a [facility] provider today. Just as with appointments in the office, your consent must be obtained to participate. Your consent will be active for this visit and any virtual visit you may have with one of our providers in the next 365 days. If you have a MyChart account, a copy of this consent can be sent to you electronically.  As this is a virtual visit, video technology does not allow for your provider to perform a traditional examination. This may limit your provider's ability to fully assess your condition. If your provider identifies any concerns that need to be evaluated in person or the need to arrange testing (such as labs, EKG, etc.), we will make arrangements to do so. Although advances in technology are sophisticated, we cannot ensure that it will always work on either your end or our end. If the connection with a video visit is poor, the visit may have to be switched to a telephone visit. With either a video or telephone visit, we are not always able to ensure that we have a secure connection.  By engaging in this virtual visit, you consent to the provision of healthcare and authorize for your insurance to be billed (if applicable) for the services provided during this visit. Depending on your insurance coverage, you may receive a charge related to this service.  I need to obtain your verbal consent now. Are you willing to proceed with your visit today? Morgan Marshall has provided verbal consent on 10/16/2024 for a virtual visit (video or telephone). Loa Lamp, FNP  Date: 10/16/2024 10:24 AM   Virtual Visit via Video Note   I, Loa Lamp, connected with  Morgan Marshall  ([id number], 2001-03-30) on 10/16/24 at 10:15 AM EST by a video-enabled telemedicine application and verified that I am speaking with the correct person using two identifiers.  Location: Patient: Virtual Visit Location Patient: Home Provider:  Virtual Visit Location Provider: Home Office   I discussed the limitations of evaluation and management by telemedicine and the availability of in person appointments. The patient expressed understanding and agreed to proceed.    History of Present Illness: Morgan Marshall is a 24 y.o. who identifies as a female who was assigned female at birth, and is being seen today for exposure to rsv at work, sx include chest congestion, sore throat, cough, history of asthma, wheezing, rt ear pain, no drainage, no fever. Feels like ear is infected. She also needs refills on duoneb for nebulizer.   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  Resting comfortably  at home.  Head is normocephalic, atraumatic.  No labored breathing.  Speech is clear and coherent with logical  content.  Patient is alert and oriented at baseline.    Assessment and Plan: 1. Upper respiratory tract infection, unspecified type (Primary)  2. Right otitis media, unspecified otitis media type  3. Asthma exacerbation, mild  4. Exposure to respiratory syncytial virus (RSV)  Increase fluids, humidifier at night,  tylenol  or ibuprofen as directed, UC if sx persist or worsen.   Follow Up Instructions: I discussed the assessment and treatment plan with the patient. The patient was provided an opportunity to ask questions and all were answered. The patient agreed with the plan and demonstrated an understanding of the instructions.  A copy of instructions were sent to the patient via MyChart unless otherwise noted below.     The patient was advised to call back or seek an in-person evaluation if the symptoms worsen or if the condition fails to improve as anticipated.    Davier Tramell, FNP     [1]  Allergies Allergen Reactions   Latex Hives  [2]  Current Outpatient Medications:    benzonatate  (TESSALON ) 200 MG capsule, Take 1 capsule (200 mg total) by mouth 3 (three) times daily as needed for up to 10 days., Disp: 30 capsule, Rfl: 0   cefdinir  (OMNICEF ) 300 MG capsule, Take 1 capsule (300 mg total) by mouth 2 (two) times daily for 10 days., Disp: 20 capsule, Rfl: 0   ipratropium-albuterol  (DUONEB) 0.5-2.5 (3) MG/3ML SOLN, Take 3 mLs by nebulization every 6 (six) hours as needed., Disp: 360 mL, Rfl: 0   predniSONE  (DELTASONE ) 20 MG tablet, Take 1 tablet (20 mg total) by mouth 2 (two) times daily with a meal for 5 days., Disp: 10 tablet, Rfl: 0   albuterol  (VENTOLIN  HFA) 108 (90 Base) MCG/ACT inhaler, Inhale 1-2 puffs into the lungs every 6 (six) hours as needed., Disp: 8 g, Rfl: 0   amoxicillin -clavulanate (AUGMENTIN ) 875-125 MG tablet, Take 1 tablet by mouth 2 (two) times daily., Disp: 20 tablet, Rfl: 0   ibuprofen (ADVIL) 800 MG tablet, Take 800 mg by mouth 3 (three) times daily., Disp: , Rfl:    mupirocin  ointment (BACTROBAN ) 2 %, Apply topically., Disp: , Rfl:    norethindrone-ethinyl estradiol-iron (AUROVELA FE 1.5/30) 1.5-30 MG-MCG tablet, Take 1 tablet by mouth daily., Disp: 90 tablet, Rfl: 3   Probiotic Product (PROBIOTIC DAILY PO), Take by mouth., Disp: , Rfl:   "

## 2024-10-16 NOTE — Patient Instructions (Signed)
 Respiratory Syncytial Virus Infection, Adult Respiratory syncytial virus (RSV) infection is an infection caused by a common virus. RSV is similar to viruses that cause the common cold and the flu. RSV infection can affect the nose, throat, windpipe, and lungs (respiratory system). When the infection is severe, it can cause: Bronchiolitis. This condition causes inflammation of the air passages in the lungs (bronchioles). Pneumonia. This condition causes inflammation of the air sacs in the lungs. RSV infection spreads from person to person (is contagious) through droplets from coughs and sneezes (respiratory secretions). This condition is rarely serious when it occurs in adults. What are the causes? This condition is caused by contact with RSV. This can happen by: Breathing respiratory secretions from someone who has the infection. Touching something that has been exposed to the virus (is contaminated) and then touching your mouth, nose, or eyes. Coming in close contact with someone who has this infection. This may happen if you: Hug or kiss. Shake or hold hands. Eat or drink using the same dishes or utensils. What increases the risk? The following factors may make you more likely to develop this condition: Being 68 years of age or older. Having certain health conditions, including: A long-term (chronic) lung condition, such as chronic obstructive pulmonary disease (COPD). A weak body defense system (immune system). Down syndrome. Heart disease. Working in a hospital or other health care facility. Living in a long-term health care facility. RSV infections are most common from the months of November to April, but they can happen any time of year. What are the signs or symptoms? Symptoms of this condition include: Having a runny nose. Coughing. You may have a cough that brings up mucus (productive cough). Sneezing. Having a fever. Wanting to eat less than usual. Making high-pitched  whistling sounds when you breathe, most often when you breathe out (wheezing). Having shortness of breath or difficulty breathing. Having fluid build up in the lungs (respiratory distress). How is this diagnosed? This condition is diagnosed based on your medical history and a physical exam. You may have tests, such as: A test of a sample of your respiratory secretions. A chest X-ray to rule out pneumonia. Blood tests or tests of mucus from your lungs (sputum). These tests may be done for older adults. How is this treated? In most cases, the RSV infection will go away after 1-2 weeks of caring for yourself at home.  Sometimes, RSV infection is severe and can cause bronchiolitis or pneumonia. If you develop one or both of these conditions, you may need to be treated in the hospital. You may be given: Oxygen therapy. Antiviral medicine. Medicines to open your bronchioles (bronchodilators). Follow these instructions at home: Medicines Take over-the-counter and prescription medicines only as told by your health care provider. If you were prescribed an antiviral medicine, take it as told by your health care provider. Do not stop using the antiviral even if you start to feel better. Lifestyle  Eat a healthy diet. Do not drink alcohol. Do not use any products that contain nicotine or tobacco. These products include cigarettes, chewing tobacco, and vaping devices, such as e-cigarettes. If you need help quitting, ask your health care provider. Rest at home until your symptoms go away. Return to your normal activities as told by your health care provider. Ask your health care provider what activities are safe for you. General instructions  Drink enough fluid to keep your urine pale yellow. Gargle with a mixture of salt and water  3-4 times a  day or as needed. To make salt water , completely dissolve -1 tsp (3-6 g) of salt in 1 cup (237 mL) of warm water . Keep all follow-up visits. How is this  prevented? To prevent catching and spreading RSV: Wash your hands often with soap and water  for at least 20 seconds. If soap and water  are not available, use hand sanitizer. Do not touch your face without first cleaning your hands. Stay home if you have symptoms of the common cold or the flu. Cover your nose and mouth when you cough or sneeze. Avoid large groups of people. Keep a safe distance of about 6 feet (1.8 m) from people who are coughing or sneezing. Where to find more information Centers for Disease Control and Prevention: footballexhibition.com.br Contact a health care provider if: Your symptoms get worse or have not changed after 2 weeks. You have: A fever. Hot flashes, sweating, or chills that keep happening. A cough that brings up much more mucus than usual. A cough that brings up blood. You feel: Very tired (lethargic). Confused. Get help right away if: You have increased or severe trouble breathing. You lose consciousness. These symptoms may be an emergency. Get help right away. Call 911. Do not wait to see if the symptoms will go away. Do not drive yourself to the hospital. This information is not intended to replace advice given to you by your health care provider. Make sure you discuss any questions you have with your health care provider. Document Revised: 07/30/2023 Document Reviewed: 10/01/2021 [employer]vier Patient Education  2025 [employer].

## 2024-10-17 ENCOUNTER — Encounter: Payer: Self-pay | Admitting: Physician Assistant

## 2024-10-17 NOTE — Telephone Encounter (Signed)
 Called pt and was advised pt has RSV and pt advised would like to go back tomorrow 10/18/24. Pt advised symptoms could linger for several weeks and if needed or not improving to let Alyssa know. Pt provided work note to return to work 10/18/24 per her request and PCP approval.

## 2024-11-18 ENCOUNTER — Ambulatory Visit (HOSPITAL_BASED_OUTPATIENT_CLINIC_OR_DEPARTMENT_OTHER)

## 2025-06-19 ENCOUNTER — Encounter: Admitting: Physician Assistant
